# Patient Record
Sex: Male | Born: 1965 | Race: White | Hispanic: No | Marital: Single | Smoking: Never smoker
Health system: Southern US, Community
[De-identification: ages and names within clinical notes are randomized; demographics above are authoritative.]

## PROBLEM LIST (undated history)

## (undated) DIAGNOSIS — F419 Anxiety disorder, unspecified: Secondary | ICD-10-CM

## (undated) DIAGNOSIS — F1010 Alcohol abuse, uncomplicated: Secondary | ICD-10-CM

## (undated) HISTORY — PX: WISDOM TOOTH EXTRACTION: SHX21

## (undated) HISTORY — PX: LACERATION REPAIR: SHX5168

---

## 2011-05-25 ENCOUNTER — Emergency Department (HOSPITAL_COMMUNITY): Payer: Self-pay

## 2011-05-25 ENCOUNTER — Emergency Department (HOSPITAL_COMMUNITY)
Admission: EM | Admit: 2011-05-25 | Discharge: 2011-05-25 | Disposition: A | Discharge status: Home / Self Care | Payer: Self-pay | Attending: Emergency Medicine | Admitting: Emergency Medicine

## 2011-05-25 DIAGNOSIS — I10 Essential (primary) hypertension: Secondary | ICD-10-CM | POA: Insufficient documentation

## 2011-05-25 DIAGNOSIS — F411 Generalized anxiety disorder: Secondary | ICD-10-CM | POA: Insufficient documentation

## 2011-05-25 DIAGNOSIS — R42 Dizziness and giddiness: Secondary | ICD-10-CM | POA: Insufficient documentation

## 2011-05-25 DIAGNOSIS — R079 Chest pain, unspecified: Secondary | ICD-10-CM | POA: Insufficient documentation

## 2011-05-25 DIAGNOSIS — R5381 Other malaise: Secondary | ICD-10-CM | POA: Insufficient documentation

## 2011-05-25 DIAGNOSIS — R0602 Shortness of breath: Secondary | ICD-10-CM | POA: Insufficient documentation

## 2011-05-25 LAB — CBC
HCT: 42.5 % (ref 39.0–52.0)
Hemoglobin: 14.7 g/dL (ref 13.0–17.0)
MCH: 33.6 pg (ref 26.0–34.0)
MCV: 97 fL (ref 78.0–100.0)
Platelets: 74 10*3/uL — ABNORMAL LOW (ref 150–400)
RBC: 4.38 MIL/uL (ref 4.22–5.81)
WBC: 5.4 10*3/uL (ref 4.0–10.5)

## 2011-05-25 LAB — COMPREHENSIVE METABOLIC PANEL
ALT: 64 U/L — ABNORMAL HIGH (ref 0–53)
Alkaline Phosphatase: 91 U/L (ref 39–117)
BUN: 7 mg/dL (ref 6–23)
Chloride: 100 mEq/L (ref 96–112)
GFR calc Af Amer: >90 mL/min (ref >90)
Glucose, Bld: 112 mg/dL — ABNORMAL HIGH (ref 70–99)
Potassium: 4.3 mEq/L (ref 3.5–5.1)
Sodium: 140 mEq/L (ref 135–145)
Total Bilirubin: 0.7 mg/dL (ref 0.3–1.2)
Total Protein: 8.2 g/dL (ref 6.0–8.3)

## 2011-05-25 LAB — RAPID URINE DRUG SCREEN, HOSP PERFORMED
Amphetamines: NOT DETECTED
Tetrahydrocannabinol: NOT DETECTED

## 2011-05-25 LAB — DIFFERENTIAL
Eosinophils Absolute: 0 10*3/uL (ref 0.0–0.7)
Monocytes Absolute: 0.8 10*3/uL (ref 0.1–1.0)

## 2012-09-14 IMAGING — CR DG CHEST 2V
2 series · 2 of 2 positions shown · non-contrast
Comparison: None.

CLINICAL DATA: Chest pain and shortness of breath.

CHEST - 2 VIEW

[w chest pa]
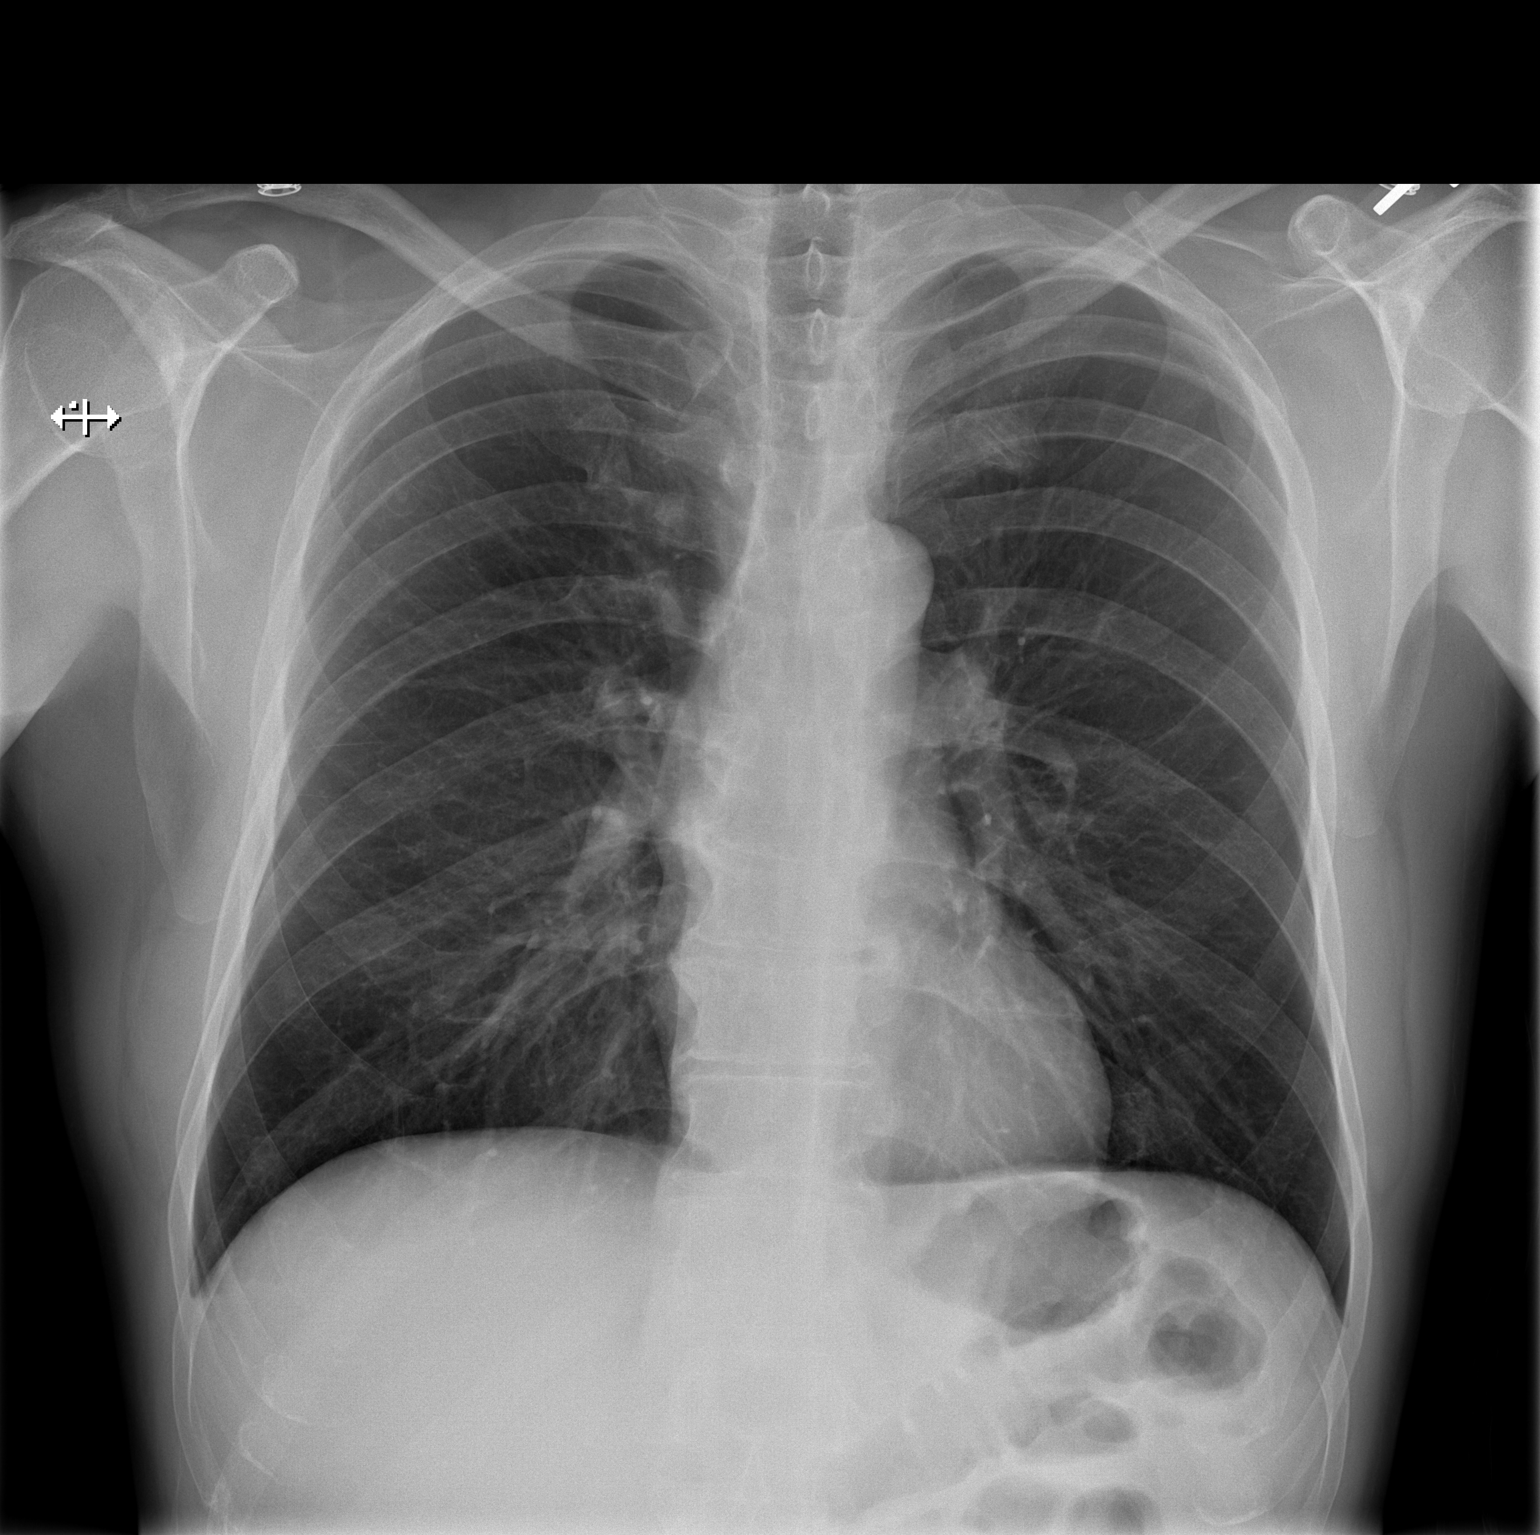

[w chest lat]
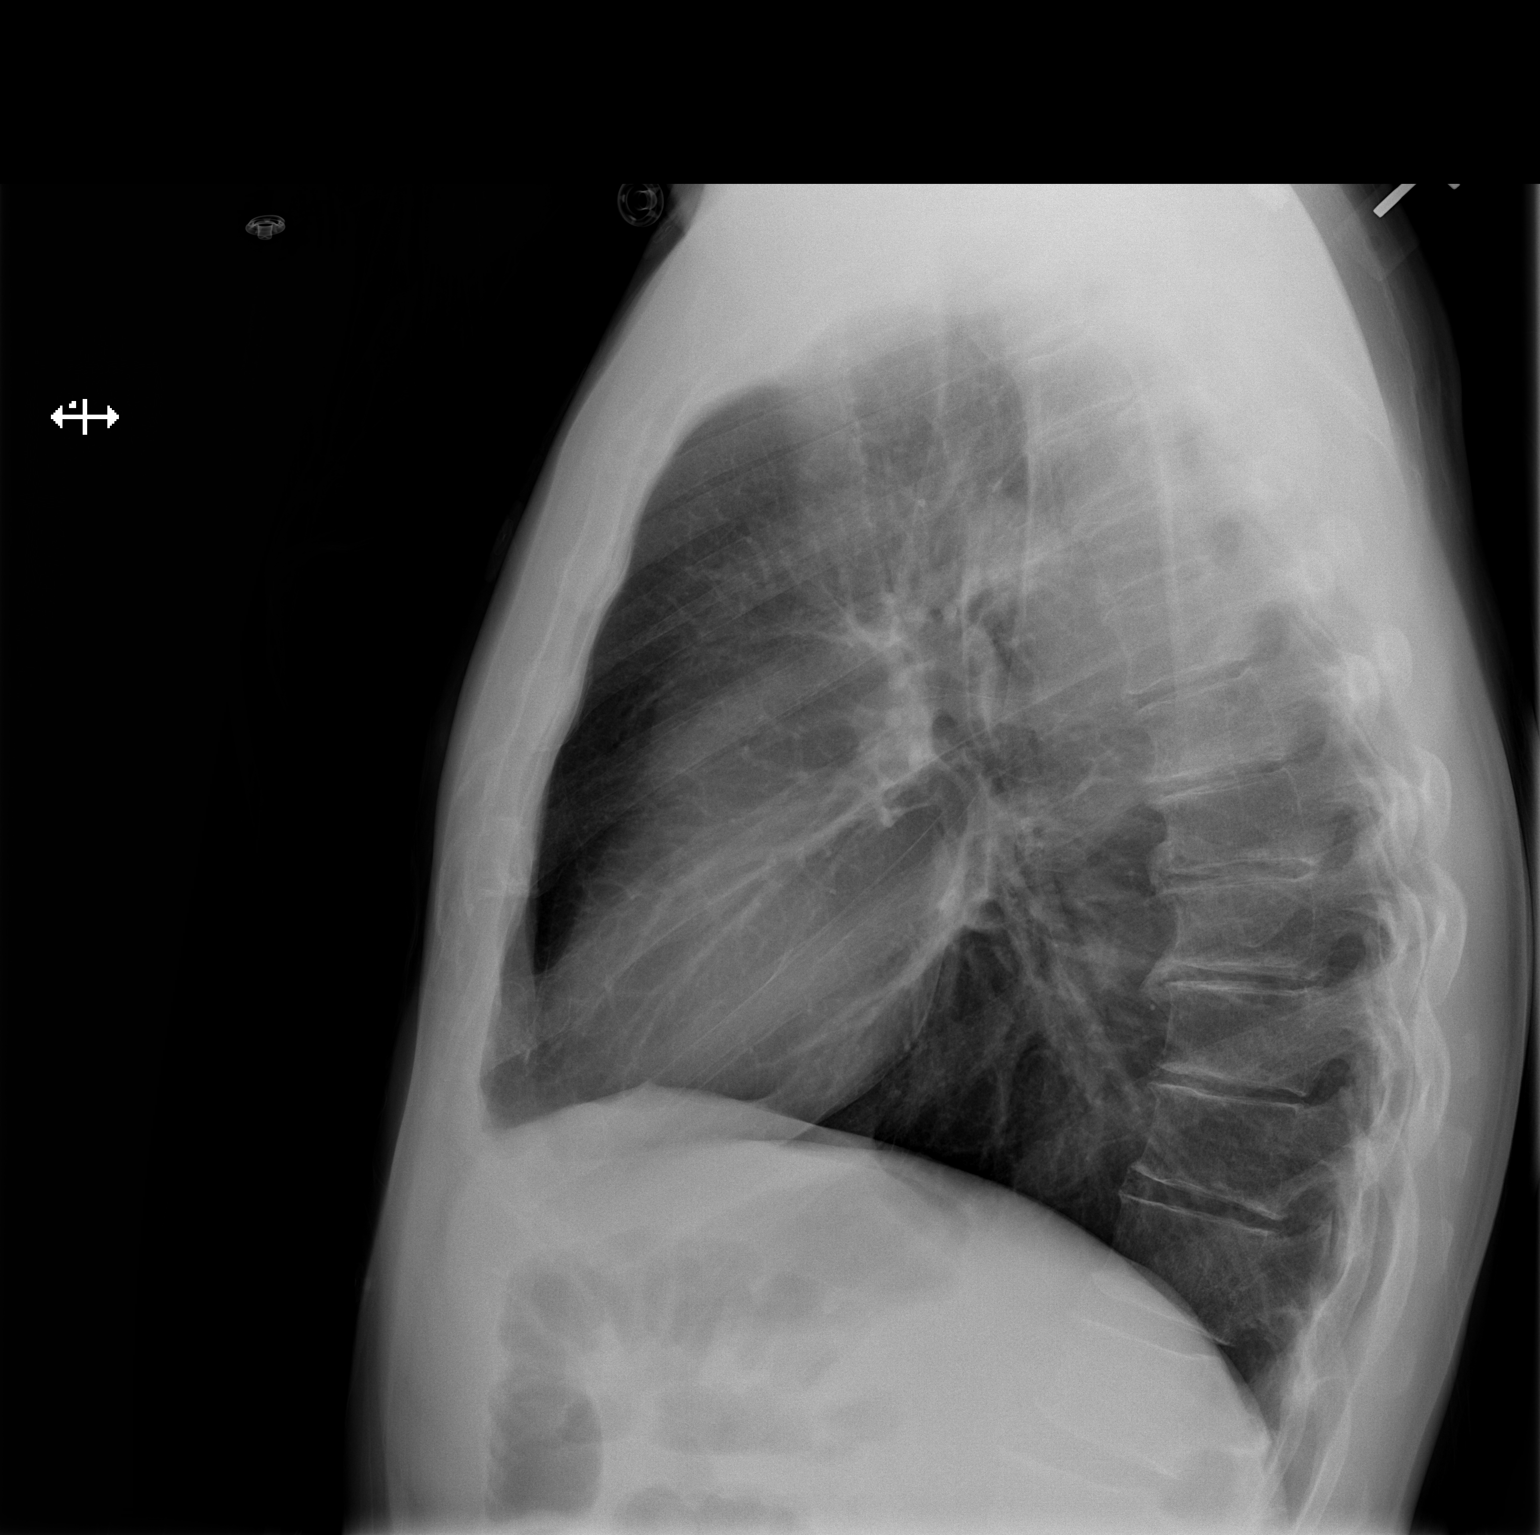

[2 of 2 positions shown; findings below may reference images not displayed]

FINDINGS: Lungs are clear.  Heart size is normal.  No pneumothorax
pleural effusion.
IMPRESSION: Negative chest.

## 2012-09-14 IMAGING — CT CT HEAD W/O CM
2 series · 16 of 30 positions shown, 20 images · non-contrast
Comparison: None.

CLINICAL DATA: Awoke with arms spinning and chest pains.

CT HEAD WITHOUT CONTRAST
TECHNIQUE: Contiguous axial images were obtained from the base of
the skull through the vertex without contrast.

[Series 2: head w/o · axial · non-contrast · 0.49mm/px · z∈[+111,+241]mm · 13 of 32 slices shown, 17 images]
[im 3/32  brain]
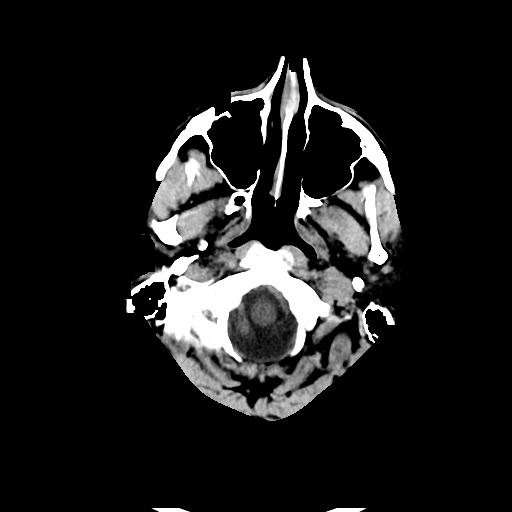
[im 3/32  bone]
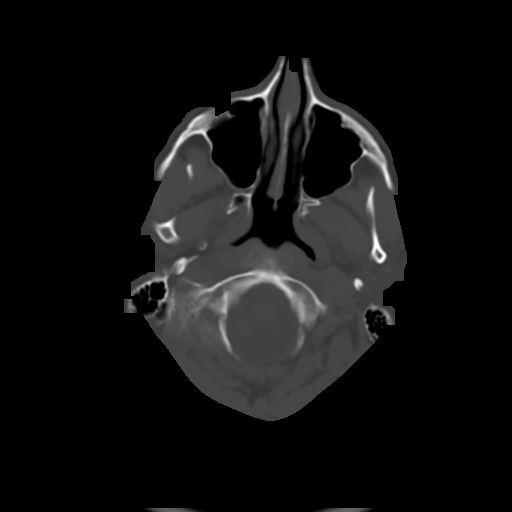
[im 5/32  brain]
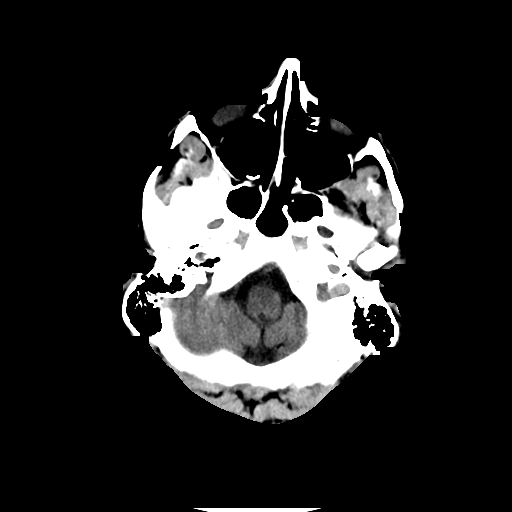
[im 7/32  brain]
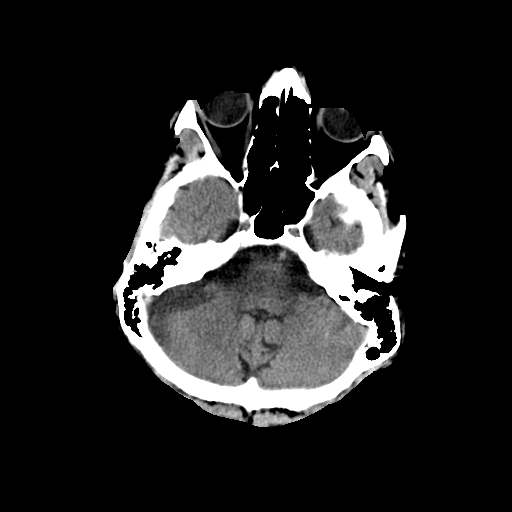
[im 9/32  brain]
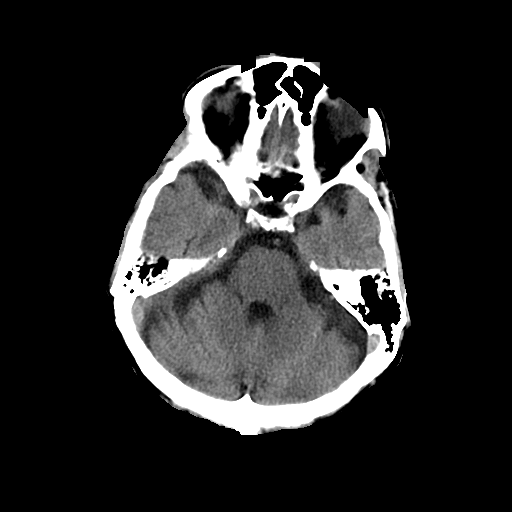
[im 12/32  brain]
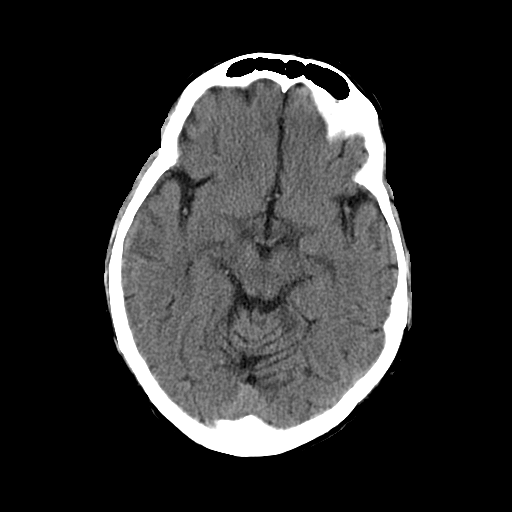
[im 12/32  bone]
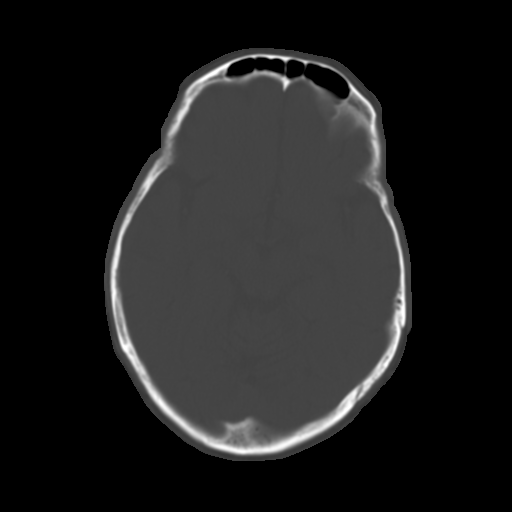
[im 14/32  brain]
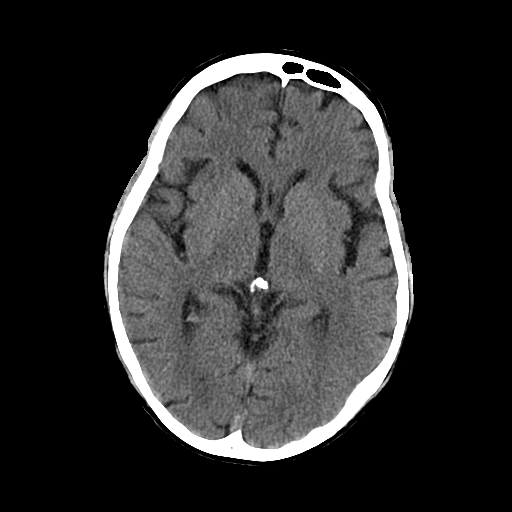
[im 16/32  brain]
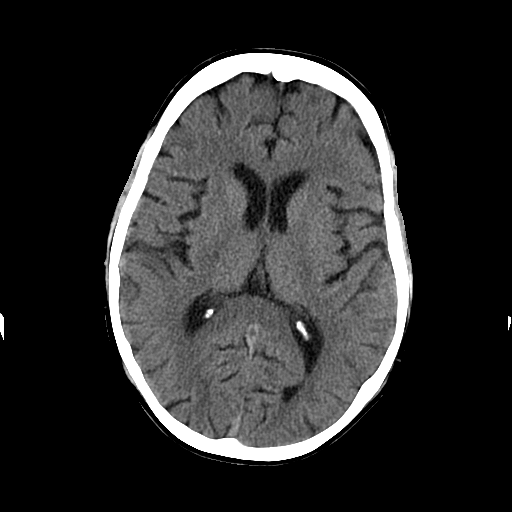
[im 18/32  brain]
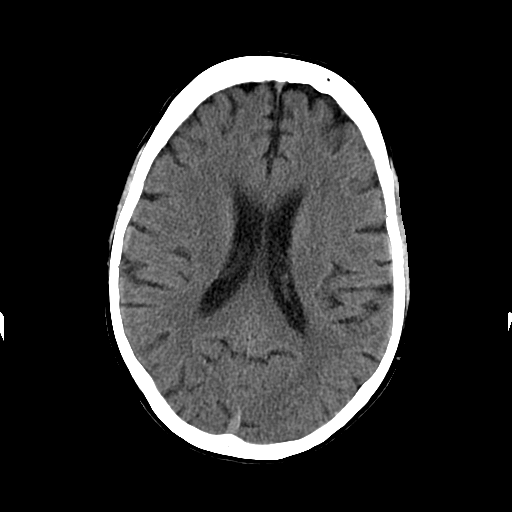
[im 20/32  brain]
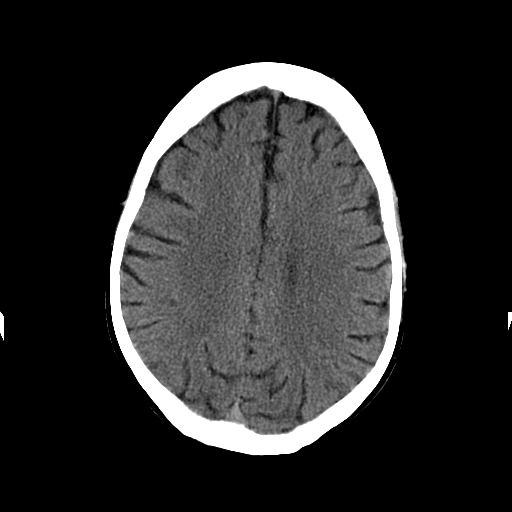
[im 20/32  bone]
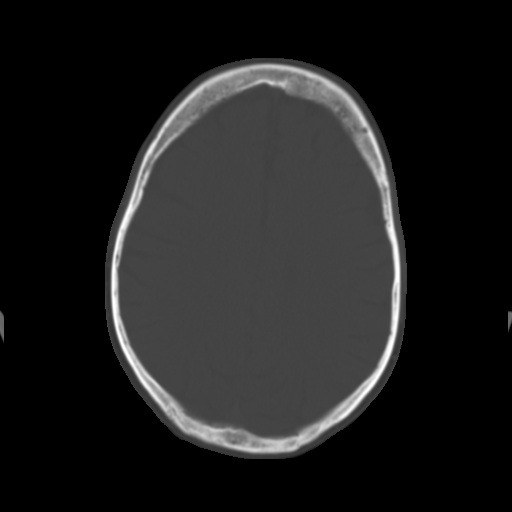
[im 23/32  brain]
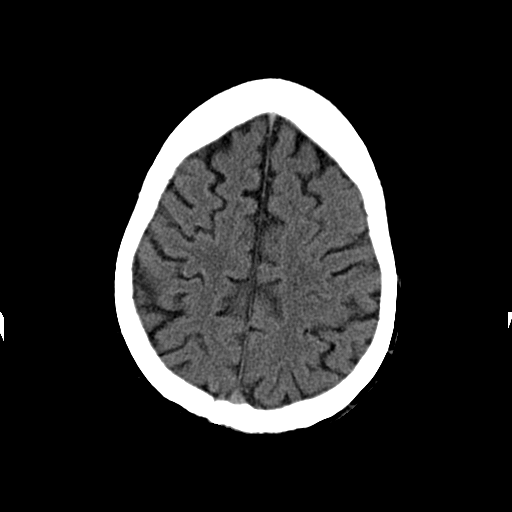
[im 25/32  brain]
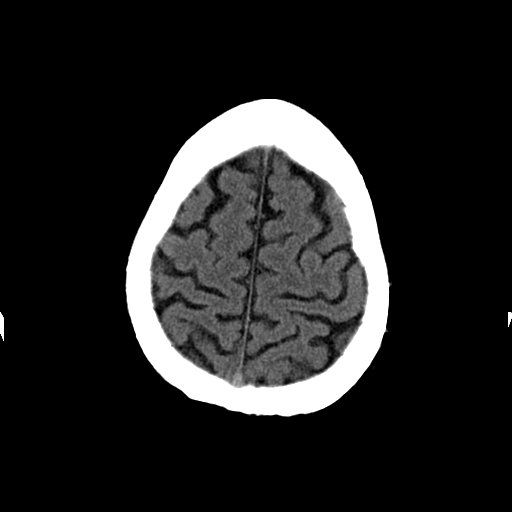
[im 27/32  brain]
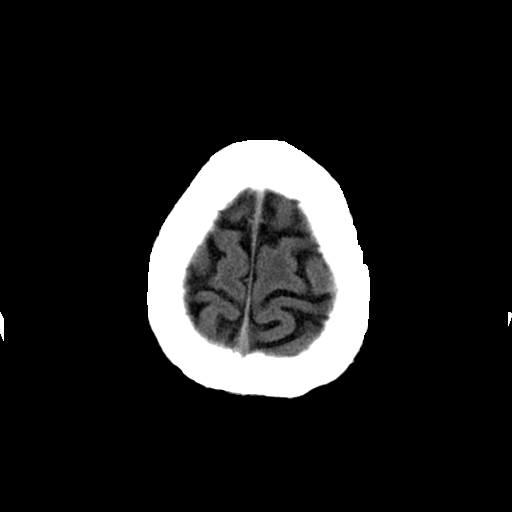
[im 29/32  brain]
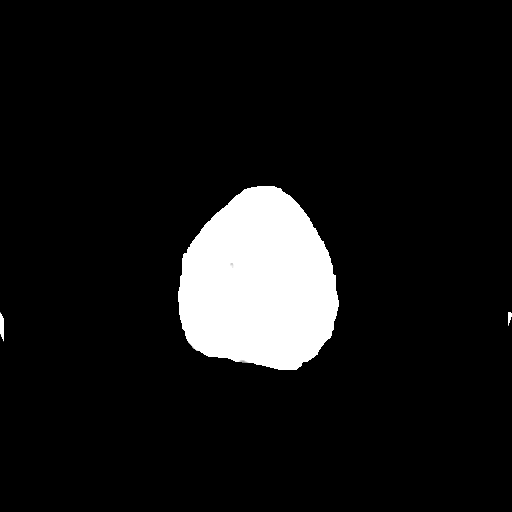
[im 29/32  bone]
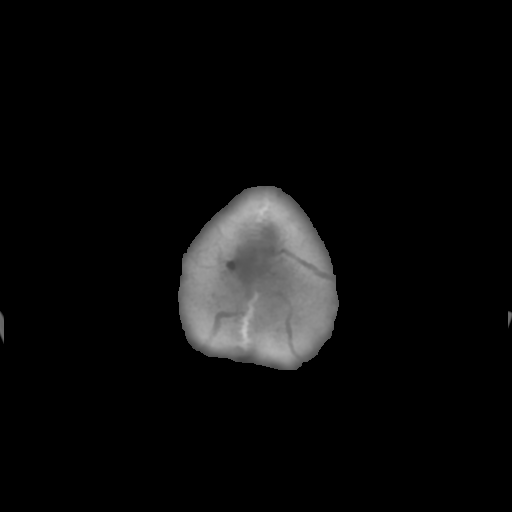

[Series 3: head w/o bone · axial · non-contrast · 0.49mm/px · z∈[+111,+156]mm · 3 of 32 slices shown]
[im 3/32  bone]
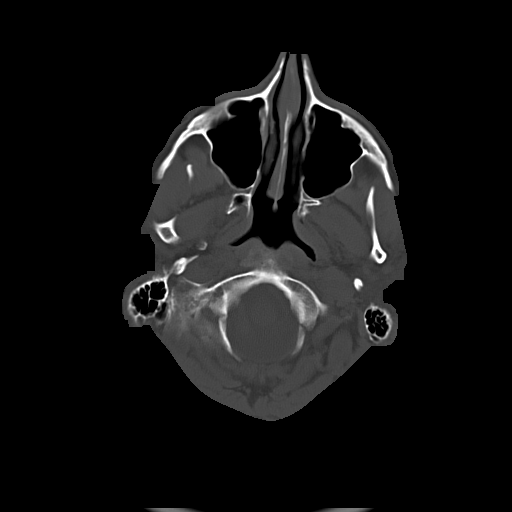
[im 7/32  bone]
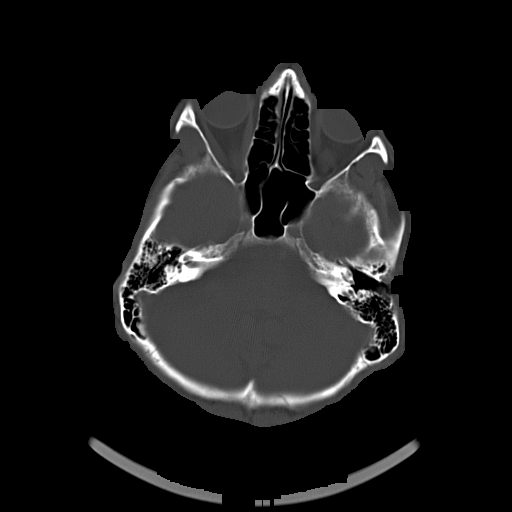
[im 12/32  bone]
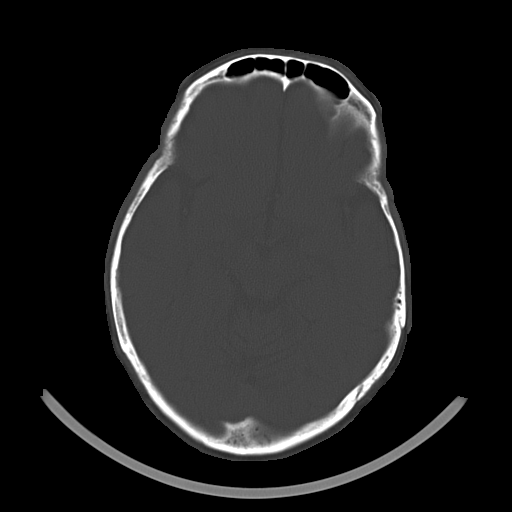

[16 of 30 positions shown; findings below may reference images not displayed]

FINDINGS: No intracranial hemorrhage.

No CT evidence of large acute infarct.  Small acute infarct cannot
be excluded by CT.

No intracranial mass lesion detected on this unenhanced exam.

Mild global atrophy.

The density of the basilar tip is equivalent to surrounding vessels
of similar size without definitive findings of thrombosis.

Sella may be partially empty.

Visualized sinuses and mastoid air cells are clear.  Orbital
structures appear to be intact.
IMPRESSION: No intracranial hemorrhage or CT evidence of large acute infarct.

Mild atrophy without hydrocephalus.

## 2012-10-17 ENCOUNTER — Encounter (HOSPITAL_COMMUNITY): Payer: Self-pay | Admitting: Emergency Medicine

## 2012-10-17 DIAGNOSIS — F1010 Alcohol abuse, uncomplicated: Secondary | ICD-10-CM | POA: Insufficient documentation

## 2012-10-17 DIAGNOSIS — I10 Essential (primary) hypertension: Secondary | ICD-10-CM | POA: Insufficient documentation

## 2012-10-17 LAB — CBC WITH DIFFERENTIAL/PLATELET
Eosinophils Absolute: 0 10*3/uL (ref 0.0–0.7)
Eosinophils Relative: 0 % (ref 0–5)
Hemoglobin: 15.1 g/dL (ref 13.0–17.0)
Lymphs Abs: 1.4 10*3/uL (ref 0.7–4.0)
MCH: 35 pg — ABNORMAL HIGH (ref 26.0–34.0)
MCHC: 35.4 g/dL (ref 30.0–36.0)
MCV: 98.8 fL (ref 78.0–100.0)
Monocytes Absolute: 1 10*3/uL (ref 0.1–1.0)
Monocytes Relative: 16 % — ABNORMAL HIGH (ref 3–12)
RBC: 4.32 MIL/uL (ref 4.22–5.81)

## 2012-10-17 LAB — COMPREHENSIVE METABOLIC PANEL
Alkaline Phosphatase: 61 U/L (ref 39–117)
BUN: 11 mg/dL (ref 6–23)
Calcium: 9.6 mg/dL (ref 8.4–10.5)
Creatinine, Ser: 0.76 mg/dL (ref 0.50–1.35)
GFR calc Af Amer: >90 mL/min (ref >90)
Glucose, Bld: 91 mg/dL (ref 70–99)
Total Protein: 8.4 g/dL — ABNORMAL HIGH (ref 6.0–8.3)

## 2012-10-17 LAB — RAPID URINE DRUG SCREEN, HOSP PERFORMED
Cocaine: NOT DETECTED
Opiates: NOT DETECTED

## 2012-10-17 LAB — ETHANOL: Alcohol, Ethyl (B): <11 mg/dL (ref 0–11)

## 2012-10-17 NOTE — ED Notes (Signed)
PT. REQUESTING DETOX FOR ALCOHOL ABUSE , LAST DRINK YESTERDAY , DENIES SUICIDAL IDEATION , CALM AND COOPERATIVE.

## 2012-10-18 ENCOUNTER — Emergency Department (HOSPITAL_COMMUNITY)
Admission: EM | Admit: 2012-10-18 | Discharge: 2012-10-21 | Disposition: A | Discharge status: Home / Self Care | Payer: Self-pay | Attending: Emergency Medicine | Admitting: Emergency Medicine

## 2012-10-18 DIAGNOSIS — F1010 Alcohol abuse, uncomplicated: Secondary | ICD-10-CM

## 2012-10-18 HISTORY — DX: Alcohol abuse, uncomplicated: F10.10

## 2012-10-18 MED ORDER — VITAMIN B-1 100 MG PO TABS
100.0000 mg | ORAL_TABLET | Freq: Every day | ORAL | Status: DC
  Administered 2012-10-18 – 2012-10-21 (×4): 100 mg via ORAL
  Filled 2012-10-18 (×4): qty 1

## 2012-10-18 MED ORDER — ONDANSETRON HCL 8 MG PO TABS: 4.0000 mg | ORAL_TABLET | Freq: Three times a day (TID) | ORAL | Status: DC | PRN

## 2012-10-18 MED ORDER — LORAZEPAM 2 MG/ML IJ SOLN
1.0000 mg | Freq: Once | INTRAMUSCULAR | Status: AC
  Administered 2012-10-18: 1 mg via INTRAVENOUS
  Filled 2012-10-18: qty 1

## 2012-10-18 MED ORDER — ZOLPIDEM TARTRATE 5 MG PO TABS: 5.0000 mg | ORAL_TABLET | Freq: Every evening | ORAL | Status: DC | PRN

## 2012-10-18 MED ORDER — ACETAMINOPHEN 325 MG PO TABS: 650.0000 mg | ORAL_TABLET | ORAL | Status: DC | PRN

## 2012-10-18 MED ORDER — LORAZEPAM 2 MG/ML IJ SOLN: 1.0000 mg | Freq: Four times a day (QID) | INTRAMUSCULAR | Status: AC | PRN

## 2012-10-18 MED ORDER — LORAZEPAM 1 MG PO TABS
1.0000 mg | ORAL_TABLET | Freq: Four times a day (QID) | ORAL | Status: AC | PRN
  Administered 2012-10-19 – 2012-10-20 (×5): 1 mg via ORAL
  Filled 2012-10-18 (×5): qty 1

## 2012-10-18 MED ORDER — LORAZEPAM 1 MG PO TABS
0.0000 mg | ORAL_TABLET | Freq: Four times a day (QID) | ORAL | Status: AC
  Administered 2012-10-18: 3 mg via ORAL
  Administered 2012-10-18: 2 mg via ORAL
  Administered 2012-10-18: 3 mg via ORAL
  Administered 2012-10-19 (×3): 1 mg via ORAL
  Filled 2012-10-18: qty 3
  Filled 2012-10-18: qty 1
  Filled 2012-10-18: qty 3
  Filled 2012-10-18: qty 1
  Filled 2012-10-18: qty 2
  Filled 2012-10-18 (×2): qty 1

## 2012-10-18 MED ORDER — LORAZEPAM 1 MG PO TABS
0.0000 mg | ORAL_TABLET | Freq: Two times a day (BID) | ORAL | Status: DC
  Administered 2012-10-19: 1 mg via ORAL

## 2012-10-18 MED ORDER — SODIUM CHLORIDE 0.9 % IV BOLUS (SEPSIS)
1000.0000 mL | Freq: Once | INTRAVENOUS | Status: AC
  Administered 2012-10-18: 1000 mL via INTRAVENOUS

## 2012-10-18 MED ORDER — ADULT MULTIVITAMIN W/MINERALS CH
1.0000 | ORAL_TABLET | Freq: Every day | ORAL | Status: DC
  Administered 2012-10-18 – 2012-10-21 (×4): 1 via ORAL
  Filled 2012-10-18 (×4): qty 1

## 2012-10-18 MED ORDER — THIAMINE HCL 100 MG/ML IJ SOLN: 100.0000 mg | Freq: Every day | INTRAMUSCULAR | Status: DC

## 2012-10-18 MED ORDER — ALUM & MAG HYDROXIDE-SIMETH 200-200-20 MG/5ML PO SUSP: 30.0000 mL | ORAL | Status: DC | PRN

## 2012-10-18 MED ORDER — FOLIC ACID 1 MG PO TABS
1.0000 mg | ORAL_TABLET | Freq: Every day | ORAL | Status: DC
  Administered 2012-10-18 – 2012-10-21 (×4): 1 mg via ORAL
  Filled 2012-10-18 (×4): qty 1

## 2012-10-18 NOTE — ED Provider Notes (Signed)
Medical screening examination/treatment/procedure(s) were performed by non-physician practitioner and as supervising physician I was immediately available for consultation/collaboration.  Olivia Mackie, MD 10/18/12 [phone]

## 2012-10-18 NOTE — ED Notes (Signed)
Pt. States "I'm feeling better". CIWA scale reflects improvement. Denies needs at this time. Pt. Cooperative and calm.

## 2012-10-18 NOTE — ED Notes (Signed)
Patient changed into paper scrubs.  Belonging inventoried and placed in locker.

## 2012-10-18 NOTE — ED Notes (Signed)
[facility] team member in room with pt

## 2012-10-18 NOTE — ED Provider Notes (Addendum)
Patient is here for alcohol detox. Last alcohol was on Wednesday. Patient was tremulous this morning and 30 minutes prior to my evaluating shin he received 3 mg of Ativan. When I saw and he states he was feeling much better only mild tremors in the hands. He is waiting for [facility] placement  8:14 AM CIWA score improved today and pt will be going to [place], MD 10/18/12 1610  Gwyneth Sprout, MD 10/19/12 [phone]

## 2012-10-18 NOTE — ED Provider Notes (Signed)
History     CSN: [id number]  Arrival date & time 10/17/12  2036   First MD Initiated Contact with Patient 10/18/12 0122      Chief Complaint  Patient presents with  . Alcohol Problem    (Consider location/radiation/quality/duration/timing/severity/associated sxs/prior treatment) HPI  47 year old male with history of alcohol abuse presents requesting for detox from alcohol. Patient reports history of addiction to alcohol, last drink was yesterday. States he is here because his whole body is shaking from withdrawal.  He admits to alcohol abuse, drinking 10-12 beers daily. He denies history of smoking, or recreational drug use. He denies SI/HI/hallucination. States he has good family support. He denies any other symptoms. He has not seek for help here in ER.   Past Medical History  Diagnosis Date  . Hypertension   . Alcohol abuse     History reviewed. No pertinent past surgical history.  No family history on file.  History  Substance Use Topics  . Smoking status: Never Smoker   . Smokeless tobacco: Not on file  . Alcohol Use: Yes      Review of Systems  Constitutional:       A complete 10 system review of systems was obtained and all systems are negative except as noted in the HPI and PMH.    Allergies  Review of patient's allergies indicates no known allergies.  Home Medications  No current outpatient prescriptions on file.  BP 141/104  Pulse 98  Temp(Src) 98.8 F (37.1 C) (Oral)  Resp 20  SpO2 98%  Physical Exam  Nursing note and vitals reviewed. Constitutional: He appears well-developed and well-nourished. No distress.  Awake, alert, nontoxic appearance  HENT:  Head: Atraumatic.  Eyes: Conjunctivae are normal. Right eye exhibits no discharge. Left eye exhibits no discharge.  Neck: Normal range of motion. Neck supple.  Cardiovascular: Normal rate and regular rhythm.   Pulmonary/Chest: Effort normal. No respiratory distress. He exhibits no tenderness.   Abdominal: Soft. There is no tenderness. There is no rebound.  Musculoskeletal: He exhibits no tenderness.  ROM appears intact, no obvious focal weakness  Neurological: He is alert. He has normal strength. He displays tremor. GCS eye subscore is 4. GCS verbal subscore is 5. GCS motor subscore is 6.  Skin: Skin is warm and dry. No rash noted.  Psychiatric: He has a normal mood and affect. He expresses no homicidal and no suicidal ideation.    ED Course  Procedures (including critical care time)  2:16 AM Pt request for alcohol detox.  Currently showing signs of alcohol withdrawal.  Psych Hold and CIWA protocol initiated.  Pt is medically cleared.  I have consulted with [facility] who agrees to continue management.    Labs Reviewed  CBC WITH DIFFERENTIAL - Abnormal; Notable for the following:    MCH 35.0 (*)    Monocytes Relative 16 (*)    Basophils Relative 2 (*)    All other components within normal limits  COMPREHENSIVE METABOLIC PANEL - Abnormal; Notable for the following:    Potassium 3.4 (*)    Chloride 90 (*)    Total Protein 8.4 (*)    AST 105 (*)    ALT 75 (*)    All other components within normal limits  ETHANOL  URINE RAPID DRUG SCREEN (HOSP PERFORMED)   No results found.   1. Alcohol abuse       MDM  BP 164/99  Pulse 73  Temp(Src) 99.4 F (37.4 C) (Oral)  Resp 22  Ht 5\' 10"  (1.778 m)  Wt 160 lb (72.576 kg)  BMI 22.96 kg/m2  SpO2 99%         Fayrene Helper, PA-C 10/18/12 [phone]

## 2012-10-18 NOTE — BH Assessment (Signed)
Assessment Note   Kenneth Good is an 47 y.o. male.  Patient came to [facility] seeking detox from ETOH.  He currently is drinking 10-12 beers daily for the last 6 years.  Patient reports that his last drink was around 02:00 on 03/20.  Patient reports that he was sober for about one year back around 2002.  Patient has not been in inpatient detox before.  His hands are shaking now and hre reports sweats, chills, tingling sensation in toes.  Patient was disoriented to date by two days.  Patient denies any SI, HI or A/V hallucinations.  Patient's material was sent to [facility] for review. Axis I: 303.90 ETOH dependence Axis II: Deferred Axis III:  Past Medical History  Diagnosis Date  . Hypertension   . Alcohol abuse    Axis IV: other psychosocial or environmental problems Axis V: 31-40 impairment in reality testing  Past Medical History:  Past Medical History  Diagnosis Date  . Hypertension   . Alcohol abuse     History reviewed. No pertinent past surgical history.  Family History: No family history on file.  Social History:  reports that he has never smoked. He does not have any smokeless tobacco history on file. He reports that  drinks alcohol. He reports that he does not use illicit drugs.  Additional Social History:  Alcohol / Drug Use Pain Medications: None reported Prescriptions: None reported Over the Counter: N/A History of alcohol / drug use?: Yes Longest period of sobriety (when/how long): Sober for a year in 2002. Negative Consequences of Use: Personal relationships Withdrawal Symptoms: Sweats;Tingling;Fever / Chills;Blackouts;Tremors;Cramps;Patient aware of relationship between substance abuse and physical/medical complications Substance #1 Name of Substance 1: Beer 1 - Age of First Use: 47 years of age 34 - Amount (size/oz): 10-12 cans daily 1 - Frequency: Daily use 1 - Duration: Last 6 years at that rate 1 - Last Use / Amount: Last [place] 02:00 on 03/20  CIWA: CIWA-Ar BP:  164/99 mmHg Pulse Rate: 73 Nausea and Vomiting: no nausea and no vomiting Tactile Disturbances: mild itching, pins and needles, burning or numbness Tremor: three Auditory Disturbances: very mild harshness or ability to frighten Paroxysmal Sweats: no sweat visible Visual Disturbances: very mild sensitivity Anxiety: mildly anxious Headache, Fullness in Head: mild Agitation: somewhat more than normal activity Orientation and Clouding of Sensorium: disoriented for data by no more than 2 calendar days CIWA-Ar Total: 13 COWS:    Allergies: No Known Allergies  Home Medications:  (Not in a hospital admission)  OB/GYN Status:  No LMP for male patient.  General Assessment Data Location of Assessment: [facility] ED Living Arrangements: Alone Can pt return to current living arrangement?: Yes Admission Status: Voluntary Is patient capable of signing voluntary admission?: Yes Transfer from: Acute Hospital Referral Source: Self/Family/Friend     Risk to self Suicidal Ideation: No Suicidal Intent: No Is patient at risk for suicide?: No Suicidal Plan?: No Access to Means: No What has been your use of drugs/alcohol within the last 12 months?: Daily use of ETOH Previous Attempts/Gestures: No How many times?: 0 Other Self Harm Risks: SA issues Triggers for Past Attempts: None known Intentional Self Injurious Behavior: None Family Suicide History: No Recent stressful life event(s): Other (Comment) (Realizing he must stop drinking) Persecutory voices/beliefs?: No Depression: No Depression Symptoms:  (Pt denies depressive symptoms.) Substance abuse history and/or treatment for substance abuse?: Yes Suicide prevention information given to non-admitted patients: Not applicable  Risk to Others Homicidal Ideation: No Thoughts of Harm  to Others: No Current Homicidal Intent: No Current Homicidal Plan: No Access to Homicidal Means: No Identified Victim: No one History of harm to others?:  No Assessment of Violence: None Noted Violent Behavior Description: Pt calm and cooperative Does patient have access to weapons?: No Criminal Charges Pending?: No Does patient have a court date: No  Psychosis Hallucinations: None noted Delusions: None noted  Mental Status Report Appear/Hygiene:  (Casual in blue scrubbs) Eye Contact: Good Motor Activity: Unsteady (Pt having tremors, reports being unsteady on feet) Speech: Logical/coherent Level of Consciousness: Quiet/awake Mood: Anxious Affect: Anxious Anxiety Level: Moderate Thought Processes: Coherent;Relevant Judgement: Impaired Orientation: Person;Place;Situation Obsessive Compulsive Thoughts/Behaviors: None  Cognitive Functioning Concentration: Normal Memory: Recent Impaired;Remote Intact IQ: Average Insight: Fair Impulse Control: Poor Appetite: Good Weight Loss: 0 Weight Gain: 0 Sleep: No Change Total Hours of Sleep: 8 Vegetative Symptoms: None  ADLScreening [facility] Services) Patient's cognitive ability adequate to safely complete daily activities?: Yes Patient able to express need for assistance with ADLs?: Yes Independently performs ADLs?: Yes (appropriate for developmental age)  Abuse/Neglect [facility]) Physical Abuse: Denies Verbal Abuse: Denies Sexual Abuse: Denies  Prior Inpatient Therapy Prior Inpatient Therapy: No Prior Therapy Dates: None Prior Therapy Facilty/Provider(s): N/A Reason for Treatment: N/a  Prior Outpatient Therapy Prior Outpatient Therapy: No Prior Therapy Dates: None Prior Therapy Facilty/Provider(s): None Reason for Treatment: N/A  ADL Screening (condition at time of admission) Patient's cognitive ability adequate to safely complete daily activities?: Yes Patient able to express need for assistance with ADLs?: Yes Independently performs ADLs?: Yes (appropriate for developmental age) Weakness of Legs: Both (Pt reports tingling/numbness in feet) Weakness of Arms/Hands:  None  Home Assistive Devices/Equipment Home Assistive Devices/Equipment: None    Abuse/Neglect Assessment (Assessment to be complete while patient is alone) Physical Abuse: Denies Verbal Abuse: Denies Sexual Abuse: Denies Exploitation of patient/patient's resources: Denies Self-Neglect: Denies     Merchant navy officer (For Healthcare) Advance Directive: Patient does not have advance directive;Patient would not like information    Additional Information 1:1 In Past 12 Months?: No CIRT Risk: No Elopement Risk: No Does patient have medical clearance?: Yes     Disposition:  Disposition Initial Assessment Completed for this Encounter: Yes Disposition of Patient: Inpatient treatment program;Referred to Type of inpatient treatment program: Adult Patient referred to: [facility]  On Site Evaluation by:   Reviewed with Physician:  Fayrene Helper, PA   Beatriz Stallion Ray 10/18/2012 4:48 AM

## 2012-10-18 NOTE — ED Notes (Signed)
Security paged to wand patient 

## 2012-10-19 MED ORDER — MECLIZINE HCL 25 MG PO TABS
25.0000 mg | ORAL_TABLET | Freq: Three times a day (TID) | ORAL | Status: DC | PRN
  Administered 2012-10-19 – 2012-10-21 (×5): 25 mg via ORAL
  Filled 2012-10-19 (×5): qty 1

## 2012-10-19 NOTE — BHH Counselor (Signed)
Pt is re-assessed today to determine his stability to participate in an etoh detox program. Pt has improved hbp and is still unstable with his gate. Pt confirms that "I've never had blood pressure medication" and "I'm real unsteady when I'm not drinking". Pt continues to have "shakes" and "unsteady". Pt would like to enter detox and said "that's too long, I have a job I gotta keep"; when told that once his vitals and gate stabilizes he could possibly go to [facility]. Pt continues to deni SI, HI, AVH or psychosis. Pt is more interested in a detox program that is less than 14 days. Pt reports that in 2002 he was clean for 1 year and said "I'ver been in treatment before, I stopped on my own". Writer called [facility] and was told by [phone], "We only had 1 discharge today, so we don't have anything; maybe tomorrow". Denice Bors, AADC 10/19/2012 2:40 PM
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
## 2012-10-20 NOTE — ED Provider Notes (Signed)
Filed Vitals:   10/20/12 1205  BP: 131/89  Pulse: 79  Temp: 98.4 F (36.9 C)  Resp: 14     Pt is comfortable, no longer in withdrawals, no evidence of DT's.  Pt likely can be discharged and follow up with intensive outpt therapy that [facility] will facilitate.  No SI, HI.  Gavin Pound. Marcos Ruelas, MD 10/20/12 [phone]

## 2012-10-20 NOTE — BHH Counselor (Signed)
Pt will be provided OP treatment information upon discharge on the morning of 10/21/12. Denice Bors, AADC 10/20/2012 6:25 PM

## 2012-10-21 MED ORDER — MECLIZINE HCL 25 MG PO TABS: 25.0000 mg | ORAL_TABLET | Freq: Three times a day (TID) | ORAL | Status: DC

## 2012-10-21 NOTE — ED Notes (Signed)
Patient is resting comfortably. 

## 2013-02-01 ENCOUNTER — Emergency Department (HOSPITAL_COMMUNITY)
Admission: EM | Admit: 2013-02-01 | Discharge: 2013-02-02 | Disposition: A | Discharge status: Home / Self Care | Payer: Self-pay | Attending: Emergency Medicine | Admitting: Emergency Medicine

## 2013-02-01 DIAGNOSIS — S61219A Laceration without foreign body of unspecified finger without damage to nail, initial encounter: Secondary | ICD-10-CM

## 2013-02-01 DIAGNOSIS — Y929 Unspecified place or not applicable: Secondary | ICD-10-CM | POA: Insufficient documentation

## 2013-02-01 DIAGNOSIS — W260XXA Contact with knife, initial encounter: Secondary | ICD-10-CM | POA: Insufficient documentation

## 2013-02-01 DIAGNOSIS — S61209A Unspecified open wound of unspecified finger without damage to nail, initial encounter: Secondary | ICD-10-CM | POA: Insufficient documentation

## 2013-02-01 DIAGNOSIS — Z79899 Other long term (current) drug therapy: Secondary | ICD-10-CM | POA: Insufficient documentation

## 2013-02-01 DIAGNOSIS — Y9389 Activity, other specified: Secondary | ICD-10-CM | POA: Insufficient documentation

## 2013-02-01 DIAGNOSIS — I10 Essential (primary) hypertension: Secondary | ICD-10-CM | POA: Insufficient documentation

## 2013-02-01 NOTE — ED Notes (Signed)
Pt states he was shucking oysters and cut his L thumb. Pt also has small lac on 4th finger on L hand. Bleeding controlled with pressure dressing. Pt ambulatory to exam room with steady gait. Pt states his wife drove him here. Pt reports he is up to date on tetanus status.

## 2013-02-01 NOTE — ED Provider Notes (Signed)
History    This chart was scribed for non-physician practitioner Marlon Pel PA-C working with Brantley Fling Ashley Jacobs, ED scribe. This patient was seen in room WTR6/WTR6 and the patient's care was started at 11:23 pm. CSN: [id number] Arrival date & time 02/01/13  2310    Chief Complaint  Patient presents with  . Extremity Laceration    The history is provided by the patient and medical records. No language interpreter was used.    HPI Comments: Kenneth Good is a 47 y.o. male who presents to the Emergency Department complaining of laceration to his L thumb and fourth finger after cutting the hand with a knife while shucking oysters 30 minutes PTA. Pt reports having moderate constant pain at the onset of the accident.  Pt's tetanus is UTD. Pt denies taking any medication PTA.   Past Medical History  Diagnosis Date  . Hypertension   . Alcohol abuse    No past surgical history on file. No family history on file. History  Substance Use Topics  . Smoking status: Never Smoker   . Smokeless tobacco: Not on file  . Alcohol Use: Yes    Review of Systems  Constitutional: Negative for fever, diaphoresis, appetite change, fatigue and unexpected weight change.  HENT: Negative for mouth sores and neck stiffness.   Eyes: Negative for visual disturbance.  Respiratory: Negative for cough, chest tightness, shortness of breath and wheezing.   Cardiovascular: Negative for chest pain.  Gastrointestinal: Negative for nausea, vomiting, abdominal pain, diarrhea and constipation.  Endocrine: Negative for polydipsia, polyphagia and polyuria.  Genitourinary: Negative for dysuria, urgency, frequency and hematuria.  Musculoskeletal: Negative for back pain.  Skin: Negative for rash. Wound: laceration to left thumb  4th finger.  Allergic/Immunologic: Negative for immunocompromised state.  Neurological: Negative for syncope, light-headedness and headaches.  Hematological: Does not  bruise/bleed easily.  Psychiatric/Behavioral: Negative for sleep disturbance. The patient is not nervous/anxious.     Allergies  Review of patient's allergies indicates no known allergies.  Home Medications   Current Outpatient Rx  Name  Route  Sig  Dispense  Refill  . meclizine (ANTIVERT) 25 MG tablet   Oral   Take 1 tablet (25 mg total) by mouth 3 (three) times daily.   21 tablet   0    BP 130/85  Pulse 78  Temp(Src) 98.1 F (36.7 C) (Oral)  Resp 16  SpO2 99% Physical Exam  Nursing note and vitals reviewed. Constitutional: He is oriented to person, place, and time. He appears well-developed and well-nourished. No distress.  HENT:  Head: Normocephalic and atraumatic.  Eyes: Conjunctivae are normal. No scleral icterus.  Neck: Normal range of motion.  Cardiovascular: Normal rate, regular rhythm and intact distal pulses.   Pulmonary/Chest: Effort normal and breath sounds normal. No respiratory distress.  Musculoskeletal: Normal range of motion. He exhibits no edema.       Left hand: He exhibits tenderness and laceration. He exhibits normal range of motion, no bony tenderness, normal two-point discrimination, normal capillary refill, no deformity and no swelling. Normal strength noted.       Hands: Neurological: He is alert and oriented to person, place, and time.  Skin: Skin is warm and dry. He is not diaphoretic.    ED Course  Procedures (including critical care time) DIAGNOSTIC STUDIES: Oxygen Saturation is 99% on room air, normal by my interpretation.    COORDINATION OF CARE: 11:25 pm. Discussed course of care with pt which includes sutures . Pt  understands and agrees.  LACERATION REPAIR PROCEDURE NOTE The patient's identification was confirmed and consent was obtained. This procedure was performed by Marlon Pel PA-C at 11:33 PM. Site: left thumb Sterile procedures observed Anesthetic used (type and amt): 2% lidocaine w/o epi, 2 cc suture type/size:4-0  ethilon Length 2 cm # of Sutures: 6 Technique: uninterrupted Antibx ointment applied Tetanus UTD  Site anesthetized, irrigated with NS, explored without evidence of foreign body, wound well approximated, site covered with dry, sterile dressing.  Patient tolerated procedure well without complications. Instructions for care discussed verbally and patient provided with additional written instructions for homecare and f/u.  LACERATION REPAIR PROCEDURE NOTE The patient's identification was confirmed and consent was obtained. This procedure was performed by Marlon Pel PA-C, at 11:33 PM. Site: fourth finger of the left hand Sterile procedures observed Anesthetic used (type and amt):2% lidocaine w/o epi, 1 cc Suture type/size:4-0 ethilon Length:1 cm # of Sutures: 2 Technique: uninterrupted Antibx ointment applied Tetanus UTD Site anesthetized, irrigated with NS, explored without evidence of foreign body, wound well approximated, site covered with dry, sterile dressing.  Patient tolerated procedure well without complications. Instructions for care discussed verbally and patient provided with additional written instructions for homecare and f/u.   Labs Reviewed - No data to display No results found. 1. Laceration of finger of left hand, initial encounter     MDM  Sutures to be removed in 7-10 days. Patient wound thoroughly cleaned and irrigated.  Tetanus up to date.  47 y.o.Kenneth Good's evaluation in the Emergency Department is complete. It has been determined that no acute conditions requiring further emergency intervention are present at this time. The patient/guardian have been advised of the diagnosis and plan. We have discussed signs and symptoms that warrant return to the ED, such as changes or worsening in symptoms.  Vital signs are stable at discharge. Filed Vitals:   02/01/13 2316  BP: 130/85  Pulse: 78  Temp: 98.1 F (36.7 C)  Resp: 16    Patient/guardian  has voiced understanding and agreed to follow-up with the PCP or specialist.   I personally performed the services described in this documentation, which was scribed in my presence. The recorded information has been reviewed and is accurate.   Dorthula Matas, PA-C 02/02/13 0015

## 2013-02-02 NOTE — ED Provider Notes (Signed)
Medical screening examination/treatment/procedure(s) were performed by non-physician practitioner and as supervising physician I was immediately available for consultation/collaboration.    Gilda Crease, MD 02/02/13 2259

## 2013-02-03 ENCOUNTER — Encounter (HOSPITAL_COMMUNITY): Payer: Self-pay | Admitting: Emergency Medicine

## 2013-02-03 ENCOUNTER — Emergency Department (HOSPITAL_COMMUNITY): Payer: Self-pay

## 2013-02-03 ENCOUNTER — Emergency Department (HOSPITAL_COMMUNITY)
Admission: EM | Admit: 2013-02-03 | Discharge: 2013-02-03 | Disposition: A | Discharge status: Home / Self Care | Payer: Self-pay | Attending: Emergency Medicine | Admitting: Emergency Medicine

## 2013-02-03 DIAGNOSIS — S02609A Fracture of mandible, unspecified, initial encounter for closed fracture: Secondary | ICD-10-CM | POA: Insufficient documentation

## 2013-02-03 DIAGNOSIS — S0081XA Abrasion of other part of head, initial encounter: Secondary | ICD-10-CM

## 2013-02-03 DIAGNOSIS — F1010 Alcohol abuse, uncomplicated: Secondary | ICD-10-CM | POA: Insufficient documentation

## 2013-02-03 DIAGNOSIS — Y939 Activity, unspecified: Secondary | ICD-10-CM | POA: Insufficient documentation

## 2013-02-03 DIAGNOSIS — W1809XA Striking against other object with subsequent fall, initial encounter: Secondary | ICD-10-CM | POA: Insufficient documentation

## 2013-02-03 DIAGNOSIS — Z4800 Encounter for change or removal of nonsurgical wound dressing: Secondary | ICD-10-CM | POA: Insufficient documentation

## 2013-02-03 DIAGNOSIS — S025XXA Fracture of tooth (traumatic), initial encounter for closed fracture: Secondary | ICD-10-CM | POA: Insufficient documentation

## 2013-02-03 DIAGNOSIS — IMO0002 Reserved for concepts with insufficient information to code with codable children: Secondary | ICD-10-CM | POA: Insufficient documentation

## 2013-02-03 DIAGNOSIS — Z23 Encounter for immunization: Secondary | ICD-10-CM | POA: Insufficient documentation

## 2013-02-03 DIAGNOSIS — Y929 Unspecified place or not applicable: Secondary | ICD-10-CM | POA: Insufficient documentation

## 2013-02-03 DIAGNOSIS — I10 Essential (primary) hypertension: Secondary | ICD-10-CM | POA: Insufficient documentation

## 2013-02-03 LAB — CBC
Hemoglobin: 15 g/dL (ref 13.0–17.0)
MCH: 31.8 pg (ref 26.0–34.0)
MCHC: 34.1 g/dL (ref 30.0–36.0)
MCV: 93.4 fL (ref 78.0–100.0)
Platelets: 199 10*3/uL (ref 150–400)
RBC: 4.71 MIL/uL (ref 4.22–5.81)

## 2013-02-03 LAB — BASIC METABOLIC PANEL
CO2: 22 mEq/L (ref 19–32)
Calcium: 9.6 mg/dL (ref 8.4–10.5)
Creatinine, Ser: 0.72 mg/dL (ref 0.50–1.35)
GFR calc non Af Amer: >90 mL/min (ref >90)
Glucose, Bld: 78 mg/dL (ref 70–99)

## 2013-02-03 MED ORDER — HYDROCODONE-ACETAMINOPHEN 7.5-325 MG/15ML PO SOLN: 15.0000 mL | Freq: Four times a day (QID) | ORAL | Status: DC | PRN

## 2013-02-03 MED ORDER — KETOROLAC TROMETHAMINE 30 MG/ML IJ SOLN: 30.0000 mg | Freq: Once | INTRAMUSCULAR | Status: DC

## 2013-02-03 MED ORDER — HYDROMORPHONE HCL PF 1 MG/ML IJ SOLN
1.0000 mg | Freq: Once | INTRAMUSCULAR | Status: AC
  Administered 2013-02-03: 1 mg via INTRAVENOUS
  Filled 2013-02-03: qty 1

## 2013-02-03 MED ORDER — HYDROMORPHONE HCL PF 1 MG/ML IJ SOLN
1.0000 mg | Freq: Once | INTRAMUSCULAR | Status: AC
  Administered 2013-02-03: 1 mg via INTRAMUSCULAR
  Filled 2013-02-03: qty 1

## 2013-02-03 MED ORDER — MORPHINE SULFATE 2 MG/ML IJ SOLN: 2.0000 mg | Freq: Once | INTRAMUSCULAR | Status: DC

## 2013-02-03 MED ORDER — TETANUS-DIPHTH-ACELL PERTUSSIS 5-2.5-18.5 LF-MCG/0.5 IM SUSP
0.5000 mL | Freq: Once | INTRAMUSCULAR | Status: AC
  Administered 2013-02-03: 0.5 mL via INTRAMUSCULAR
  Filled 2013-02-03: qty 0.5

## 2013-02-03 NOTE — Progress Notes (Signed)
[facility] has seen patient and provided him with a list of primary care resources. °

## 2013-02-03 NOTE — ED Notes (Signed)
Pt sts he drinks a few beers each day and is beginning to have tremors, md notified

## 2013-02-03 NOTE — ED Notes (Signed)
Pt sts he broke a tooth when he fell

## 2013-02-03 NOTE — ED Notes (Addendum)
Pt tripped and fell onto concrete 4 hrs ago. L/side of face struck step. Multiple facial abrasions noted on both lips with open wound on tip of chin. Bleeding controlled. Pt stated that his face is swollen and his jaw feels out of alignment. "feels a broken tooth on back lower jaw". Denies LOC  Pt has 11 sutures in l/thumb due to laceration 2 days ago LAST TETANUS OVER 10 YEARS

## 2013-02-03 NOTE — ED Notes (Signed)
Awaiting surgery consult.

## 2013-02-03 NOTE — ED Provider Notes (Signed)
History    CSN: [id number] Arrival date & time 02/03/13  1218  First MD Initiated Contact with Patient 02/03/13 1306     Chief Complaint  Patient presents with  . Facial Swelling  . Dental Injury  . Wound Check    7/5 l/thumb suture check  . Fall  . Head Injury    fell onto cement step 4 hrs ago. Denies LOC    HPI Pt reports jaw pain and lacerations of his lip and his chin that occurred this morning after falling over his cat. Mild to moderate pain. + trismus and malocclusioin. No LOC. No HA. No anticoagulant use. No other complaints. Tetanus updated 10 years ago. Pain is moderate in severity. Pain of left mandible pain  Past Medical History  Diagnosis Date  . Hypertension   . Alcohol abuse    History reviewed. No pertinent past surgical history. History reviewed. No pertinent family history. History  Substance Use Topics  . Smoking status: Never Smoker   . Smokeless tobacco: Not on file  . Alcohol Use: Yes    Review of Systems  All other systems reviewed and are negative.    Allergies  Review of patient's allergies indicates no known allergies.  Home Medications  No current outpatient prescriptions on file. BP 135/85  Pulse 94  Temp(Src) 98.4 F (36.9 C) (Oral)  Resp 18  Wt 165 lb (74.844 kg)  BMI 23.68 kg/m2  SpO2 100%  Physical Exam  Nursing note and vitals reviewed. Constitutional: He is oriented to person, place, and time. He appears well-developed and well-nourished.  HENT:  Head: Normocephalic and atraumatic.  Mild trismus and malocclusion.  Facial abrasions of both his upper lip and his chin without laceration.  Dentition without significant abnormality  Eyes: EOM are normal.  Neck: Normal range of motion.  Cardiovascular: Normal rate, regular rhythm, normal heart sounds and intact distal pulses.   Pulmonary/Chest: Effort normal and breath sounds normal. No respiratory distress.  Abdominal: Soft. He exhibits no distension. There is no  tenderness.  Genitourinary: Rectum normal.  Musculoskeletal: Normal range of motion.  Neurological: He is alert and oriented to person, place, and time.  Skin: Skin is warm and dry.  Psychiatric: He has a normal mood and affect. Judgment normal.    ED Course  Procedures (including critical care time) Labs Reviewed  CBC  BASIC METABOLIC PANEL   Ct Maxillofacial Wo Cm  02/03/2013   *RADIOLOGY REPORT*  Clinical Data: Trip and fall.  Left facial injury.  Facial abrasions and bleeding.  CT MAXILLOFACIAL WITHOUT CONTRAST  Technique:  Multidetector CT imaging of the maxillofacial structures was performed. Multiplanar CT image reconstructions were also generated.  Comparison: 05/25/2011  Findings: Bilateral mildly comminuted mandibular condyle fractures extend into the condylar articular surfaces.  There is also a nondisplaced fracture through the left mandibular angle, extending through a periapical lucency of tooth #18.  There is also a large cavity in tooth #18.  Zygomatic arches and pterygoid plates intact.  No additional facial fracture observed.  Orbits unremarkable.  IMPRESSION:  1.  Three mandibular fractures are present.  There are bilateral mildly comminuted fractures of the mandibular condyles extending to the articular surfaces, and also a nondisplaced fracture of the left mandibular angle extending through a periapical lucency of decayed tooth #18.   Original Report Authenticated By: Gaylyn Rong, M.D.   I personally reviewed the imaging tests through PACS system I reviewed available ER/hospitalization records through the EMR   1. Mandible  fracture, closed, initial encounter   2. Facial abrasion, initial encounter     MDM  2:59 PM Spoke with Dr Jearld Fenton of ENT personally reviewed the CT scan.  He will see the patient in the office tomorrow the next day.  He requests soft diet and pain medication.  The patient's areas of his face her facial abrasions and there is no indication for  repair.  Tetanus updated  Lyanne Co, MD 02/03/13 1622

## 2013-02-04 ENCOUNTER — Encounter (HOSPITAL_COMMUNITY): Payer: Self-pay | Admitting: Pharmacy Technician

## 2013-02-05 ENCOUNTER — Encounter (HOSPITAL_COMMUNITY): Payer: Self-pay | Admitting: Certified Registered"

## 2013-02-05 ENCOUNTER — Other Ambulatory Visit: Payer: Self-pay | Admitting: Otolaryngology

## 2013-02-05 NOTE — Preoperative (Signed)
Beta Blockers   Reason not to administer Beta Blockers:Not Applicable 

## 2013-02-06 ENCOUNTER — Ambulatory Visit (HOSPITAL_COMMUNITY)
Admission: RE | Admit: 2013-02-06 | Discharge: 2013-02-06 | Disposition: A | Discharge status: Home / Self Care | Payer: MEDICAID | Source: Ambulatory Visit | Attending: Otolaryngology | Admitting: Otolaryngology

## 2013-02-06 ENCOUNTER — Encounter (HOSPITAL_COMMUNITY): Payer: Self-pay | Admitting: Certified Registered"

## 2013-02-06 ENCOUNTER — Encounter (HOSPITAL_COMMUNITY)
Admission: RE | Disposition: A | Discharge status: Home / Self Care | Payer: Self-pay | Source: Ambulatory Visit | Attending: Otolaryngology

## 2013-02-06 ENCOUNTER — Encounter (HOSPITAL_COMMUNITY): Payer: Self-pay | Admitting: Anesthesiology

## 2013-02-06 ENCOUNTER — Ambulatory Visit (HOSPITAL_COMMUNITY): Payer: Self-pay | Admitting: Certified Registered"

## 2013-02-06 DIAGNOSIS — F1010 Alcohol abuse, uncomplicated: Secondary | ICD-10-CM | POA: Insufficient documentation

## 2013-02-06 DIAGNOSIS — X58XXXA Exposure to other specified factors, initial encounter: Secondary | ICD-10-CM | POA: Insufficient documentation

## 2013-02-06 DIAGNOSIS — S02640A Fracture of ramus of mandible, unspecified side, initial encounter for closed fracture: Secondary | ICD-10-CM | POA: Insufficient documentation

## 2013-02-06 DIAGNOSIS — I10 Essential (primary) hypertension: Secondary | ICD-10-CM | POA: Insufficient documentation

## 2013-02-06 DIAGNOSIS — S02610A Fracture of condylar process of mandible, unspecified side, initial encounter for closed fracture: Secondary | ICD-10-CM | POA: Insufficient documentation

## 2013-02-06 DIAGNOSIS — S02609A Fracture of mandible, unspecified, initial encounter for closed fracture: Secondary | ICD-10-CM

## 2013-02-06 HISTORY — PX: ORIF MANDIBULAR FRACTURE: SHX2127

## 2013-02-06 HISTORY — PX: CLOSED REDUCTION MANDIBLE: SHX5307

## 2013-02-06 LAB — CBC
HCT: 42.7 % (ref 39.0–52.0)
Hemoglobin: 14.2 g/dL (ref 13.0–17.0)
MCV: 96.2 fL (ref 78.0–100.0)
Platelets: 146 10*3/uL — ABNORMAL LOW (ref 150–400)
RBC: 4.44 MIL/uL (ref 4.22–5.81)
WBC: 4.5 10*3/uL (ref 4.0–10.5)

## 2013-02-06 SURGERY — CLOSED REDUCTION, MANDIBLE
Anesthesia: General | Site: Mouth | Wound class: Clean Contaminated

## 2013-02-06 MED ORDER — CLINDAMYCIN PHOSPHATE 600 MG/50ML IV SOLN
INTRAVENOUS | Status: AC
  Administered 2013-02-06: 600 mg via INTRAVENOUS
  Filled 2013-02-06: qty 50

## 2013-02-06 MED ORDER — LIDOCAINE-EPINEPHRINE 1 %-1:100000 IJ SOLN
INTRAMUSCULAR | Status: DC | PRN
  Administered 2013-02-06: 1 mL

## 2013-02-06 MED ORDER — CLINDAMYCIN HCL 300 MG PO CAPS: 300.0000 mg | ORAL_CAPSULE | Freq: Four times a day (QID) | ORAL | Status: DC

## 2013-02-06 MED ORDER — OXYMETAZOLINE HCL 0.05 % NA SOLN
NASAL | Status: AC
  Filled 2013-02-06: qty 15

## 2013-02-06 MED ORDER — DEXAMETHASONE SODIUM PHOSPHATE 4 MG/ML IJ SOLN
INTRAMUSCULAR | Status: DC | PRN
  Administered 2013-02-06: 8 mg via INTRAVENOUS

## 2013-02-06 MED ORDER — GLYCOPYRROLATE 0.2 MG/ML IJ SOLN
INTRAMUSCULAR | Status: DC | PRN
  Administered 2013-02-06: 0.2 mg via INTRAVENOUS

## 2013-02-06 MED ORDER — FENTANYL CITRATE 0.05 MG/ML IJ SOLN
INTRAMUSCULAR | Status: DC | PRN
  Administered 2013-02-06 (×4): 50 ug via INTRAVENOUS

## 2013-02-06 MED ORDER — HYDROMORPHONE HCL PF 1 MG/ML IJ SOLN
0.2500 mg | INTRAMUSCULAR | Status: DC | PRN
  Administered 2013-02-06 (×3): 0.5 mg via INTRAVENOUS

## 2013-02-06 MED ORDER — ROCURONIUM BROMIDE 100 MG/10ML IV SOLN
INTRAVENOUS | Status: DC | PRN
  Administered 2013-02-06: 30 mg via INTRAVENOUS

## 2013-02-06 MED ORDER — LIDOCAINE HCL (CARDIAC) 20 MG/ML IV SOLN
INTRAVENOUS | Status: DC | PRN
  Administered 2013-02-06: 80 mg via INTRAVENOUS

## 2013-02-06 MED ORDER — LIDOCAINE-EPINEPHRINE 1 %-1:100000 IJ SOLN
INTRAMUSCULAR | Status: AC
  Filled 2013-02-06: qty 1

## 2013-02-06 MED ORDER — HYDROMORPHONE HCL PF 1 MG/ML IJ SOLN
INTRAMUSCULAR | Status: AC
  Filled 2013-02-06: qty 1

## 2013-02-06 MED ORDER — PROPOFOL 10 MG/ML IV BOLUS
INTRAVENOUS | Status: DC | PRN
  Administered 2013-02-06: 200 mg via INTRAVENOUS

## 2013-02-06 MED ORDER — LACTATED RINGERS IV SOLN
INTRAVENOUS | Status: DC | PRN
  Administered 2013-02-06 (×2): via INTRAVENOUS

## 2013-02-06 MED ORDER — MIDAZOLAM HCL 5 MG/5ML IJ SOLN
INTRAMUSCULAR | Status: DC | PRN
  Administered 2013-02-06: 2 mg via INTRAVENOUS

## 2013-02-06 MED ORDER — OXYMETAZOLINE HCL 0.05 % NA SOLN
NASAL | Status: DC | PRN
  Administered 2013-02-06: 1

## 2013-02-06 MED ORDER — ONDANSETRON HCL 4 MG/2ML IJ SOLN
INTRAMUSCULAR | Status: DC | PRN
  Administered 2013-02-06: 4 mg via INTRAVENOUS

## 2013-02-06 MED ORDER — NEOSTIGMINE METHYLSULFATE 1 MG/ML IJ SOLN
INTRAMUSCULAR | Status: DC | PRN
  Administered 2013-02-06: 2 mg via INTRAVENOUS

## 2013-02-06 MED ORDER — 0.9 % SODIUM CHLORIDE (POUR BTL) OPTIME
TOPICAL | Status: DC | PRN
  Administered 2013-02-06: 1000 mL

## 2013-02-06 SURGICAL SUPPLY — 37 items
BLADE SURG 15 STRL LF DISP TIS (BLADE) ×2 IMPLANT
BLADE SURG 15 STRL SS (BLADE) ×1
CANISTER SUCTION 2500CC (MISCELLANEOUS) IMPLANT
CLOTH BEACON ORANGE TIMEOUT ST (SAFETY) ×3 IMPLANT
COVER SURGICAL LIGHT HANDLE (MISCELLANEOUS) ×3 IMPLANT
CRADLE DONUT ADULT HEAD (MISCELLANEOUS) ×3 IMPLANT
DECANTER SPIKE VIAL GLASS SM (MISCELLANEOUS) ×3 IMPLANT
ELECT COATED BLADE 2.86 ST (ELECTRODE) ×6 IMPLANT
GAUZE SPONGE 4X4 16PLY XRAY LF (GAUZE/BANDAGES/DRESSINGS) ×3 IMPLANT
GLOVE BIO SURGEON STRL SZ7.5 (GLOVE) ×3 IMPLANT
GLOVE BIOGEL M 6.5 STRL (GLOVE) ×3 IMPLANT
GLOVE BIOGEL PI IND STRL 6.5 (GLOVE) ×2 IMPLANT
GLOVE BIOGEL PI IND STRL 7.5 (GLOVE) ×2 IMPLANT
GLOVE BIOGEL PI INDICATOR 6.5 (GLOVE) ×1
GLOVE BIOGEL PI INDICATOR 7.5 (GLOVE) ×1
GLOVE ECLIPSE 7.5 STRL STRAW (GLOVE) ×3 IMPLANT
GLOVE SURG SS PI 7.0 STRL IVOR (GLOVE) ×3 IMPLANT
GOWN STRL NON-REIN LRG LVL3 (GOWN DISPOSABLE) ×9 IMPLANT
KIT BASIN OR (CUSTOM PROCEDURE TRAY) ×3 IMPLANT
KIT ROOM TURNOVER OR (KITS) ×3 IMPLANT
NEEDLE 27GAX1X1/2 (NEEDLE) ×3 IMPLANT
NS IRRIG 1000ML POUR BTL (IV SOLUTION) ×3 IMPLANT
PACK SURGICAL SETUP 50X90 (CUSTOM PROCEDURE TRAY) ×3 IMPLANT
PAD ARMBOARD 7.5X6 YLW CONV (MISCELLANEOUS) ×3 IMPLANT
PENCIL BUTTON HOLSTER BLD 10FT (ELECTRODE) ×6 IMPLANT
PLATE HYBRID GOLD MMF (Plate) ×2 IMPLANT
PLATE HYBRID MMF GOLD (Plate) ×4 IMPLANT
SCISSORS WIRE ANG 4 3/4 DISP (INSTRUMENTS) ×3 IMPLANT
SCREW LOCK SELFDRIL 2.0X8M MMF (Screw) ×24 IMPLANT
SUCTION FRAZIER TIP 10 FR DISP (SUCTIONS) IMPLANT
SUT CHROMIC 3 0 PS 2 (SUTURE) ×3 IMPLANT
SUT CHROMIC 4 0 PS 2 18 (SUTURE) ×3 IMPLANT
SYR BULB 3OZ (MISCELLANEOUS) ×3 IMPLANT
SYR CONTROL 10ML LL (SYRINGE) ×3 IMPLANT
TUBE CONNECTING 12X1/4 (SUCTIONS) ×6 IMPLANT
WATER STERILE IRR 1000ML POUR (IV SOLUTION) IMPLANT
YANKAUER SUCT BULB TIP NO VENT (SUCTIONS) ×3 IMPLANT

## 2013-02-06 NOTE — Progress Notes (Signed)
wirecutters at bedside 

## 2013-02-06 NOTE — Anesthesia Procedure Notes (Signed)
Procedure Name: Intubation Date/Time: 02/06/2013 7:51 AM Performed by: Jerilee Hoh Pre-anesthesia Checklist: Patient identified, Emergency Drugs available, Suction available and Patient being monitored Patient Re-evaluated:Patient Re-evaluated prior to inductionOxygen Delivery Method: Circle system utilized Preoxygenation: Pre-oxygenation with 100% oxygen Intubation Type: IV induction Ventilation: Mask ventilation without difficulty Laryngoscope Size: Mac and 4 Grade View: Grade I Nasal Tubes: Nasal Rae, Magill forceps- large, utilized, Nasal prep performed and Right Tube size: 7.5 mm Number of attempts: 1 Placement Confirmation: positive ETCO2,  ETT inserted through vocal cords under direct vision and breath sounds checked- equal and bilateral Tube secured with: Tape Dental Injury: Teeth and Oropharynx as per pre-operative assessment

## 2013-02-06 NOTE — Transfer of Care (Signed)
Immediate Anesthesia Transfer of Care Note  Patient: Kenneth Good  Procedure(s) Performed: Procedure(s): CLOSED REDUCTION MANDIBULAR FRACTURE (N/A) MAXILLARY MANDIBULAR FIXATION (N/A)  Patient Location: PACU  Anesthesia Type:General  Level of Consciousness: awake, alert , oriented and patient cooperative  Airway & Oxygen Therapy: Patient Spontanous Breathing and Patient connected to face mask oxygen  Post-op Assessment: Report given to PACU RN, Post -op Vital signs reviewed and stable and Patient moving all extremities  Post vital signs: Reviewed and stable  Complications: No apparent anesthesia complications

## 2013-02-06 NOTE — Anesthesia Preprocedure Evaluation (Addendum)
Anesthesia Evaluation  Patient identified by MRN, date of birth, ID band Patient awake    Reviewed: Allergy & Precautions, H&P , NPO status , Patient's Chart, lab work & pertinent test results  Airway Mallampati: II      Dental   Pulmonary neg pulmonary ROS,  breath sounds clear to auscultation        Cardiovascular hypertension, Rhythm:Regular Rate:Normal     Neuro/Psych    GI/Hepatic negative GI ROS, Neg liver ROS,   Endo/Other  negative endocrine ROS  Renal/GU negative Renal ROS     Musculoskeletal   Abdominal   Peds  Hematology   Anesthesia Other Findings   Reproductive/Obstetrics                          Anesthesia Physical Anesthesia Plan  ASA: I  Anesthesia Plan: General   Post-op Pain Management:    Induction: Intravenous  Airway Management Planned: Nasal ETT  Additional Equipment:   Intra-op Plan:   Post-operative Plan: Extubation in OR  Informed Consent: I have reviewed the patients History and Physical, chart, labs and discussed the procedure including the risks, benefits and alternatives for the proposed anesthesia with the patient or authorized representative who has indicated his/her understanding and acceptance.   Dental advisory given  Plan Discussed with: CRNA, Anesthesiologist and Surgeon  Anesthesia Plan Comments:        Anesthesia Quick Evaluation

## 2013-02-06 NOTE — Anesthesia Postprocedure Evaluation (Signed)
  Anesthesia Post-op Note  Patient: Kenneth Good  Procedure(s) Performed: Procedure(s): CLOSED REDUCTION MANDIBULAR FRACTURE (N/A) MAXILLARY MANDIBULAR FIXATION (N/A)  Patient Location: PACU  Anesthesia Type:General  Level of Consciousness: awake  Airway and Oxygen Therapy: Patient Spontanous Breathing  Post-op Pain: mild  Post-op Assessment: Post-op Vital signs reviewed  Post-op Vital Signs: Reviewed  Complications: No apparent anesthesia complications

## 2013-02-06 NOTE — H&P (Signed)
Kodi Steil is an 47 y.o. male.   Chief Complaint: jaw fx HPI: hx of bilateral condylar fx and left ramus nondisplaced. Here for surgery   Past Medical History  Diagnosis Date  . Alcohol abuse   . Hypertension     not current    Past Surgical History  Procedure Laterality Date  . Wisdom tooth extraction    . Laceration repair      arm    History reviewed. No pertinent family history. Social History:  reports that he has never smoked. He does not have any smokeless tobacco history on file. He reports that he drinks about 1.8 ounces of alcohol per week. He reports that he does not use illicit drugs.  Allergies: No Known Allergies  Medications Prior to Admission  Medication Sig Dispense Refill  . HYDROcodone-acetaminophen (HYCET) 7.5-325 mg/15 ml solution Take 15 mLs by mouth 4 (four) times daily as needed for pain.  120 mL  0  . Multiple Vitamin (MULTIVITAMIN WITH MINERALS) TABS Take 1 tablet by mouth daily.        Results for orders placed during the hospital encounter of 02/06/13 (from the past 48 hour(s))  CBC     Status: Abnormal   Collection Time    02/06/13  6:54 AM      Result Value Range   WBC 4.5  4.0 - 10.5 K/uL   RBC 4.44  4.22 - 5.81 MIL/uL   Hemoglobin 14.2  13.0 - 17.0 g/dL   HCT 16.1  09.6 - 04.5 %   MCV 96.2  78.0 - 100.0 fL   MCH 32.0  26.0 - 34.0 pg   MCHC 33.3  30.0 - 36.0 g/dL   RDW 40.9  81.1 - 91.4 %   Platelets 146 (*) 150 - 400 K/uL   No results found.  Review of Systems  Constitutional: Negative.   HENT: Negative.   Eyes: Negative.   Respiratory: Negative.   Cardiovascular: Negative.   Skin: Negative.     Blood pressure 143/101, pulse 80, temperature 98.4 F (36.9 C), temperature source Oral, resp. rate 20, height 5\' 10"  (1.778 m), weight 59.5 kg (131 lb 2.8 oz), SpO2 100.00%. Physical Exam  Constitutional: He appears well-developed.  HENT:  Malocclusion and poor dentition. Open bite deformity  Neck: Normal range of motion. Neck  supple.  Cardiovascular: Normal rate.   Respiratory: Effort normal.  GI: Soft.     Assessment/Plan Mandible fx- discussed the MMF and possibly open repair of the left ramus intraoral. He is ready to proceed  Suzanna Obey 02/06/2013, 7:26 AM

## 2013-02-06 NOTE — Op Note (Signed)
Preop/postop diagnoses: Mandible fracture Procedure: maxillary mandibular fixation and exploration of left ramus fracture Anesthesia: Gen. Estimated blood loss: Less than 10 cc Indications: 47 year old who's had a fall to his chin and sustained a bilateral condylar fractures that are displaced. He has a open bite deformity. He also has a nondisplaced fracture of the left ramus region which does not appear to be completely bicortical. Does go through a bad tooth on the left side. 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 He may always have some difficulty opening his mouth because of this situation. We discussed open repair of the condylar fractures which is generally makes the problem worse. He understands the maxillary mandibular fixation is to get his occlusion back into position and then a rubber band treatment will be subsequent. Exploration of the left fracture or put a plate on that since it is planned for him to be in fixation for 2 weeks will be made.we talked about his dental problems and bad teeth and for right now removing any teeth will be delayed and he will need to see a dentist for further evaluation. This does have a increased risk of infection from his mandible fracture. Risks, benefits, and options were discussed. All questions are answered and consent was obtained. Operation: Patient was taken to the operating room and after nasal endotracheal tube intubation was placed in the supine position. The arch bar plates were positioned and then to where the screws were positioned between roots. 4 screws were placed on the upper plate 2 on each side lining up the plate along the teeth. The lower plate was placed in the same fashion with 4 screws 2 on each side. Once these were positioned the occlusion was placed. The patient was pulled of  his mandible downward and allow the teeth canal positioned back in their normal occlusion. They seem to come together nicely. Wires were then placed 2 on each side securing his occlusion in its anatomic position. A decision was then made the left side laterally just at the level of the molar. This was dissected down to the bone and with Freer dissection the periosteum was taken off the bone and the area was explored. Going up toward the ramus/sub-condyle and down into the body anterior to the first molar there was no identified fracture. The jaw was moved to see if any fracture could be identified and there was no fracture that was evident so no plate was placed.the wound was irrigated and closed with a locking running 3-0 chromic. The patient was then awakened brought to cover stable condition counts correct

## 2013-02-06 NOTE — Progress Notes (Signed)
Report given to philip rn as caregiver 

## 2013-02-07 ENCOUNTER — Encounter (HOSPITAL_COMMUNITY): Payer: Self-pay | Admitting: Otolaryngology

## 2013-03-21 ENCOUNTER — Ambulatory Visit (HOSPITAL_COMMUNITY)
Admission: RE | Admit: 2013-03-21 | Discharge: 2013-03-21 | Disposition: A | Discharge status: Home / Self Care | Payer: Self-pay | Source: Ambulatory Visit | Attending: Otolaryngology | Admitting: Otolaryngology

## 2013-03-21 ENCOUNTER — Other Ambulatory Visit (HOSPITAL_COMMUNITY): Payer: Self-pay | Admitting: Otolaryngology

## 2013-03-21 DIAGNOSIS — R52 Pain, unspecified: Secondary | ICD-10-CM

## 2013-03-21 DIAGNOSIS — Z4789 Encounter for other orthopedic aftercare: Secondary | ICD-10-CM | POA: Insufficient documentation

## 2013-03-26 ENCOUNTER — Encounter (HOSPITAL_BASED_OUTPATIENT_CLINIC_OR_DEPARTMENT_OTHER): Payer: Self-pay | Admitting: *Deleted

## 2013-03-27 ENCOUNTER — Encounter (HOSPITAL_BASED_OUTPATIENT_CLINIC_OR_DEPARTMENT_OTHER): Payer: Self-pay | Admitting: *Deleted

## 2013-03-27 ENCOUNTER — Encounter (HOSPITAL_BASED_OUTPATIENT_CLINIC_OR_DEPARTMENT_OTHER)
Admission: RE | Disposition: A | Discharge status: Home / Self Care | Payer: Self-pay | Source: Ambulatory Visit | Attending: Otolaryngology

## 2013-03-27 ENCOUNTER — Ambulatory Visit (HOSPITAL_BASED_OUTPATIENT_CLINIC_OR_DEPARTMENT_OTHER)
Admission: RE | Admit: 2013-03-27 | Discharge: 2013-03-27 | Disposition: A | Discharge status: Home / Self Care | Payer: Self-pay | Source: Ambulatory Visit | Attending: Otolaryngology | Admitting: Otolaryngology

## 2013-03-27 ENCOUNTER — Ambulatory Visit (HOSPITAL_BASED_OUTPATIENT_CLINIC_OR_DEPARTMENT_OTHER): Payer: Self-pay | Admitting: Anesthesiology

## 2013-03-27 ENCOUNTER — Encounter (HOSPITAL_BASED_OUTPATIENT_CLINIC_OR_DEPARTMENT_OTHER): Payer: Self-pay | Admitting: Anesthesiology

## 2013-03-27 DIAGNOSIS — IMO0001 Reserved for inherently not codable concepts without codable children: Secondary | ICD-10-CM | POA: Insufficient documentation

## 2013-03-27 DIAGNOSIS — Z472 Encounter for removal of internal fixation device: Secondary | ICD-10-CM | POA: Insufficient documentation

## 2013-03-27 DIAGNOSIS — F1010 Alcohol abuse, uncomplicated: Secondary | ICD-10-CM | POA: Insufficient documentation

## 2013-03-27 DIAGNOSIS — I10 Essential (primary) hypertension: Secondary | ICD-10-CM | POA: Insufficient documentation

## 2013-03-27 HISTORY — PX: MANDIBULAR HARDWARE REMOVAL: SHX5205

## 2013-03-27 SURGERY — REMOVAL, HARDWARE, MANDIBLE
Anesthesia: General | Site: Mouth | Wound class: Clean Contaminated

## 2013-03-27 MED ORDER — FENTANYL CITRATE 0.05 MG/ML IJ SOLN: 50.0000 ug | INTRAMUSCULAR | Status: DC | PRN

## 2013-03-27 MED ORDER — ONDANSETRON HCL 4 MG/2ML IJ SOLN
INTRAMUSCULAR | Status: DC | PRN
  Administered 2013-03-27: 4 mg via INTRAVENOUS

## 2013-03-27 MED ORDER — LACTATED RINGERS IV SOLN
INTRAVENOUS | Status: DC
  Administered 2013-03-27: 08:00:00 via INTRAVENOUS

## 2013-03-27 MED ORDER — MIDAZOLAM HCL 5 MG/5ML IJ SOLN
INTRAMUSCULAR | Status: DC | PRN
  Administered 2013-03-27: 2 mg via INTRAVENOUS

## 2013-03-27 MED ORDER — FENTANYL CITRATE 0.05 MG/ML IJ SOLN: 50.0000 ug | Freq: Once | INTRAMUSCULAR | Status: DC

## 2013-03-27 MED ORDER — LIDOCAINE HCL (CARDIAC) 20 MG/ML IV SOLN
INTRAVENOUS | Status: DC | PRN
  Administered 2013-03-27: 75 mg via INTRAVENOUS

## 2013-03-27 MED ORDER — MIDAZOLAM HCL 2 MG/2ML IJ SOLN: 1.0000 mg | INTRAMUSCULAR | Status: DC | PRN

## 2013-03-27 MED ORDER — PROPOFOL 10 MG/ML IV BOLUS
INTRAVENOUS | Status: DC | PRN
  Administered 2013-03-27: 150 mg via INTRAVENOUS

## 2013-03-27 SURGICAL SUPPLY — 21 items
BLADE SURG 15 STRL LF DISP TIS (BLADE) ×1 IMPLANT
BLADE SURG 15 STRL SS (BLADE) ×1
CANISTER SUCTION 1200CC (MISCELLANEOUS) ×2 IMPLANT
CLOTH BEACON ORANGE TIMEOUT ST (SAFETY) ×2 IMPLANT
COVER MAYO STAND STRL (DRAPES) ×2 IMPLANT
DECANTER SPIKE VIAL GLASS SM (MISCELLANEOUS) ×2 IMPLANT
GLOVE ECLIPSE 6.5 STRL STRAW (GLOVE) ×2 IMPLANT
GLOVE SS BIOGEL STRL SZ 7.5 (GLOVE) ×1 IMPLANT
GLOVE SUPERSENSE BIOGEL SZ 7.5 (GLOVE) ×1
GOWN PREVENTION PLUS XLARGE (GOWN DISPOSABLE) ×4 IMPLANT
MARKER SKIN DUAL TIP RULER LAB (MISCELLANEOUS) IMPLANT
NEEDLE 27GAX1X1/2 (NEEDLE) IMPLANT
PACK BASIN DAY SURGERY FS (CUSTOM PROCEDURE TRAY) ×2 IMPLANT
SCISSORS WIRE ANG 4 3/4 DISP (INSTRUMENTS) IMPLANT
SHEET MEDIUM DRAPE 40X70 STRL (DRAPES) ×2 IMPLANT
SUT CHROMIC 3 0 PS 2 (SUTURE) IMPLANT
SUT CHROMIC 4 0 PS 2 18 (SUTURE) IMPLANT
SYR CONTROL 10ML LL (SYRINGE) IMPLANT
TOWEL OR 17X24 6PK STRL BLUE (TOWEL DISPOSABLE) ×4 IMPLANT
TUBE CONNECTING 20X1/4 (TUBING) ×2 IMPLANT
YANKAUER SUCT BULB TIP NO VENT (SUCTIONS) ×2 IMPLANT

## 2013-03-27 NOTE — H&P (Signed)
Kenneth Good is an 47 y.o. male.   Chief Complaint: jaw fx  HPI: hx of orif for mandible fx. Now needs hardware off.  Past Medical History  Diagnosis Date  . Alcohol abuse     Past Surgical History  Procedure Laterality Date  . Wisdom tooth extraction    . Laceration repair      arm  . Closed reduction mandible N/A 02/06/2013    Procedure: CLOSED REDUCTION MANDIBULAR FRACTURE;  Surgeon: Suzanna Obey, MD;  Location: [facility] OR;  Service: ENT;  Laterality: N/A;  . Orif mandibular fracture N/A 02/06/2013    Procedure: MAXILLARY MANDIBULAR FIXATION;  Surgeon: Suzanna Obey, MD;  Location: [facility] OR;  Service: ENT;  Laterality: N/A;    History reviewed. No pertinent family history. Social History:  reports that he has never smoked. He does not have any smokeless tobacco history on file. He reports that he does not drink alcohol or use illicit drugs.  Allergies: No Known Allergies  Medications Prior to Admission  Medication Sig Dispense Refill  . acetaminophen (TYLENOL) 160 MG/5ML suspension Take 15 mg/kg by mouth every 4 (four) hours as needed for fever.      . feeding supplement (BOOST HIGH PROTEIN) LIQD Take 1 Container by mouth 3 (three) times daily between meals.      . Multiple Vitamin (MULTIVITAMIN WITH MINERALS) TABS Take 1 tablet by mouth daily.        No results found for this or any previous visit (from the past 48 hour(s)). No results found.  Review of Systems  Constitutional: Negative.   HENT: Negative.   Eyes: Negative.   Respiratory: Negative.   Cardiovascular: Negative.   Skin: Negative.     Blood pressure 128/90, pulse 75, temperature 97.8 F (36.6 C), temperature source Oral, resp. rate 16, height 5\' 11"  (1.803 m), weight 56.881 kg (125 lb 6.4 oz), SpO2 99.00%. Physical Exam  Constitutional: He appears well-developed.  HENT:  Nose: Nose normal.  Eyes: Conjunctivae are normal. Pupils are equal, round, and reactive to light.  Neck: Normal range of motion. Neck supple.   Cardiovascular: Normal rate.   Respiratory: Effort normal.  GI: Soft.     Assessment/Plan Mandible fx- ready to remove hardware  Suzanna Obey 03/27/2013, 8:37 AM

## 2013-03-27 NOTE — Anesthesia Preprocedure Evaluation (Addendum)
Anesthesia Evaluation  Patient identified by MRN, date of birth, ID band Patient awake    Reviewed: Allergy & Precautions, H&P , NPO status , Patient's Chart, lab work & pertinent test results  Airway Mallampati: II TM Distance: >3 FB Neck ROM: Full    Dental   Pulmonary neg pulmonary ROS,  breath sounds clear to auscultation        Cardiovascular hypertension, Rhythm:Regular Rate:Normal     Neuro/Psych    GI/Hepatic negative GI ROS, Neg liver ROS, (+)     substance abuse  alcohol use,   Endo/Other  negative endocrine ROS  Renal/GU negative Renal ROS     Musculoskeletal   Abdominal   Peds  Hematology   Anesthesia Other Findings   Reproductive/Obstetrics                          Anesthesia Physical Anesthesia Plan  ASA: III  Anesthesia Plan: General   Post-op Pain Management:    Induction: Intravenous  Airway Management Planned: Mask  Additional Equipment:   Intra-op Plan:   Post-operative Plan: Extubation in OR  Informed Consent: I have reviewed the patients History and Physical, chart, labs and discussed the procedure including the risks, benefits and alternatives for the proposed anesthesia with the patient or authorized representative who has indicated his/her understanding and acceptance.     Plan Discussed with: CRNA and Surgeon  Anesthesia Plan Comments:       Anesthesia Quick Evaluation

## 2013-03-27 NOTE — Anesthesia Postprocedure Evaluation (Signed)
  Anesthesia Post-op Note  Patient: Kenneth Good  Procedure(s) Performed: Procedure(s): MANDIBULAR HARDWARE REMOVAL AND ARCH BAR REMOVAL  (N/A)  Patient Location: PACU  Anesthesia Type:General  Level of Consciousness: awake  Airway and Oxygen Therapy: Patient Spontanous Breathing  Post-op Pain: mild  Post-op Assessment: Post-op Vital signs reviewed, Patient's Cardiovascular Status Stable, Respiratory Function Stable, Patent Airway, No signs of Nausea or vomiting and Pain level controlled  Post-op Vital Signs: stable  Complications: No apparent anesthesia complications

## 2013-03-27 NOTE — Op Note (Signed)
Preop/postop diagnosis: Mandible fracture with hardware Procedure: Removal of hardware Anesthesia: Sedation Estimated blood loss approximately 5 cc Indications: 47 year old with a history of the mandible fracture and now is 6 weeks out. He has seemed to do well. He has no pain. His occlusion seems to be good. It is now time for removal of his arch bars. He was informed a risk and benefits of the procedure and options were discussed all questions are answered and consent was obtained. Operation: Patient was taken to the operating room placed in the supine position after sedation with anesthesia the arch bars upper and lower were removed by removing the screws. They came off easily. He had a minimal amount of bleeding which was controlled easily. His teeth were in very poor repair. He tolerated the procedure well. He was awake and brought to cover stable condition counts correct

## 2013-03-27 NOTE — Transfer of Care (Signed)
Immediate Anesthesia Transfer of Care Note  Patient: Kenneth Good  Procedure(s) Performed: Procedure(s): MANDIBULAR HARDWARE REMOVAL AND ARCH BAR REMOVAL  (N/A)  Patient Location: PACU  Anesthesia Type:MAC  Level of Consciousness: awake, alert  and oriented  Airway & Oxygen Therapy: Patient Spontanous Breathing and Patient connected to face mask oxygen  Post-op Assessment: Report given to PACU RN and Post -op Vital signs reviewed and stable  Post vital signs: Reviewed and stable  Complications: No apparent anesthesia complications

## 2013-03-27 NOTE — Anesthesia Procedure Notes (Signed)
Procedure Name: MAC Date/Time: 03/27/2013 8:54 AM Performed by: Zenia Resides D Pre-anesthesia Checklist: Patient identified, Timeout performed, Emergency Drugs available, Suction available and Patient being monitored Patient Re-evaluated:Patient Re-evaluated prior to inductionOxygen Delivery Method: Simple face mask Intubation Type: IV induction

## 2013-03-28 ENCOUNTER — Encounter (HOSPITAL_BASED_OUTPATIENT_CLINIC_OR_DEPARTMENT_OTHER): Payer: Self-pay | Admitting: Otolaryngology

## 2013-04-02 NOTE — Addendum Note (Signed)
Addended by: Leonette Most on: 04/02/2013 06:24 PM   Modules accepted: Orders

## 2013-07-05 ENCOUNTER — Ambulatory Visit (HOSPITAL_COMMUNITY)
Admission: RE | Admit: 2013-07-05 | Discharge: 2013-07-05 | Disposition: A | Discharge status: Home / Self Care | Payer: Self-pay | Attending: Psychiatry | Admitting: Psychiatry

## 2013-07-05 NOTE — BH Assessment (Signed)
Assessment Note  Kenneth Good is an 47 y.o. male presenting to [facility] as a walk in. Sts he has a 2 yr history of alcoholism. He reports that over the past 2 yrs he has drank heavily. He reports drinking 12-20 beers per day. During the 2 yrs he did receive detox 1x in the ER at [facility] approx. March of this year. Sts he was their for 6 days. Patient resumed drinking 2-3 months later after a accident. Sts that he was in pain and the pain pills were not enough. Pt used alcohol to self medicate. Patient relapsed in July of this year. Since resuming his drinking he has continued to drink 12-20 beers. However, patient suddenly stopped 9 days ago. Sts that he has not had a craving or desire to drink. Pt presents today asking for further help.   Pt referred to the CD-IOP program and will follow up with Daneil Dolin for a start date.    Axis I: Alcohol Abuse Axis II: Deferred Axis III:  Past Medical History  Diagnosis Date  . Alcohol abuse    Axis IV: other psychosocial or environmental problems, problems related to social environment, problems with access to health care services and problems with primary support group Axis V: 31-40 impairment in reality testing  Past Medical History:  Past Medical History  Diagnosis Date  . Alcohol abuse     Past Surgical History  Procedure Laterality Date  . Wisdom tooth extraction    . Laceration repair      arm  . Closed reduction mandible N/A 02/06/2013    Procedure: CLOSED REDUCTION MANDIBULAR FRACTURE;  Surgeon: Suzanna Obey, MD;  Location: [facility] OR;  Service: ENT;  Laterality: N/A;  . Orif mandibular fracture N/A 02/06/2013    Procedure: MAXILLARY MANDIBULAR FIXATION;  Surgeon: Suzanna Obey, MD;  Location: [facility] OR;  Service: ENT;  Laterality: N/A;  . Mandibular hardware removal N/A 03/27/2013    Procedure: MANDIBULAR HARDWARE REMOVAL AND ARCH BAR REMOVAL ;  Surgeon: Suzanna Obey, MD;  Location: [facility];  Service: ENT;  Laterality: N/A;    Family  History: No family history on file.  Social History:  reports that he has never smoked. He does not have any smokeless tobacco history on file. He reports that he does not drink alcohol or use illicit drugs.  Additional Social History:  Alcohol / Drug Use Pain Medications: None reported Prescriptions: None reported  Over the Counter: None reported  History of alcohol / drug use?: Yes Longest period of sobriety (when/how long): 9 days of sobriety Substance #1 Name of Substance 1: Alcohol  1 - Age of First Use: 47 yrs old  1 - Amount (size/oz): 12-20 beers 1 - Frequency: daily for the past 2 yrs with various periods of sobriety  1 - Duration: ongoing  1 - Last Use / Amount: 9 days ago  CIWA:   COWS:    Allergies: No Known Allergies  Home Medications:  (Not in a hospital admission)  OB/GYN Status:  No LMP for male patient.  General Assessment Data Location of Assessment: [facility] Is this a Tele or Face-to-Face Assessment?: Face-to-Face Is this an Initial Assessment or a Re-assessment for this encounter?: Initial Assessment Living Arrangements: Other (Comment) (pt lives alone) Can pt return to current living arrangement?: Yes Admission Status: Voluntary Is patient capable of signing voluntary admission?: Yes Transfer from: Acute Hospital Referral Source: Self/Family/Friend     [facility] Crisis Care Plan Living Arrangements: Other (Comment) (pt  lives alone) Name of Psychiatrist:  (No psychiatrist ) Name of Therapist:  (No therapist )  Education Status Is patient currently in school?: No  Risk to self Suicidal Ideation: No Suicidal Intent: No Is patient at risk for suicide?: No Suicidal Plan?: No Access to Means: No What has been your use of drugs/alcohol within the last 12 months?:  (n/a) Previous Attempts/Gestures: No How many times?:  (n/a) Other Self Harm Risks:  (n/a) Triggers for Past Attempts: Other (Comment) Intentional Self Injurious Behavior: None Family  Suicide History: No Recent stressful life event(s): Legal Issues Persecutory voices/beliefs?: No Depression: No Substance abuse history and/or treatment for substance abuse?: No Suicide prevention information given to non-admitted patients: Not applicable  Risk to Others Homicidal Ideation: No Thoughts of Harm to Others: No Current Homicidal Intent: No Current Homicidal Plan: No Access to Homicidal Means: No Identified Victim:  (n/a) History of harm to others?: No Assessment of Violence: None Noted Violent Behavior Description:  (patient is calm and cooperative) Does patient have access to weapons?: No Criminal Charges Pending?: Yes Describe Pending Criminal Charges:  (DUI) Does patient have a court date: Yes Court Date:  (patient unable to recall court date)  Psychosis Hallucinations: None noted Delusions: None noted  Mental Status Report Appear/Hygiene: Disheveled Eye Contact: Fair Motor Activity: Freedom of movement Speech: Logical/coherent Level of Consciousness: Alert Mood: Other (Comment) (appropriate ) Affect: Appropriate to circumstance Anxiety Level: Moderate Thought Processes: Coherent;Relevant Judgement: Unimpaired Orientation: Person;Time;Place;Situation Obsessive Compulsive Thoughts/Behaviors: None  Cognitive Functioning Concentration: Normal Memory: Recent Intact;Remote Intact IQ: Average Insight: Good Impulse Control: Good Appetite: Good Weight Loss:  (none reported) Weight Gain:  (none reported) Sleep: No Change Total Hours of Sleep:  (varies) Vegetative Symptoms: None  ADLScreening [facility] Assessment Services) Patient's cognitive ability adequate to safely complete daily activities?: Yes Patient able to express need for assistance with ADLs?: Yes Independently performs ADLs?: Yes (appropriate for developmental age)  Prior Inpatient Therapy Prior Inpatient Therapy: No Prior Therapy Dates:  (n/a) Prior Therapy Facilty/Provider(s):   (n/a) Reason for Treatment:  (n/a)  Prior Outpatient Therapy Prior Outpatient Therapy: No Prior Therapy Dates:  (n/a) Prior Therapy Facilty/Provider(s):  (n/a) Reason for Treatment: n/a  ADL Screening (condition at time of admission) Patient's cognitive ability adequate to safely complete daily activities?: Yes Is the patient deaf or have difficulty hearing?: No Does the patient have difficulty seeing, even when wearing glasses/contacts?: No Does the patient have difficulty concentrating, remembering, or making decisions?: No Patient able to express need for assistance with ADLs?: Yes Does the patient have difficulty dressing or bathing?: No Independently performs ADLs?: Yes (appropriate for developmental age) Communication: Independent Dressing (OT): Independent Grooming: Independent Feeding: Independent Bathing: Independent Toileting: Independent In/Out Bed: Independent Walks in Home: Independent Does the patient have difficulty walking or climbing stairs?: No Weakness of Legs: None Weakness of Arms/Hands: None  Home Assistive Devices/Equipment Home Assistive Devices/Equipment: None    Abuse/Neglect Assessment (Assessment to be complete while patient is alone) Physical Abuse: Denies Verbal Abuse: Denies Sexual Abuse: Denies Exploitation of patient/patient's resources: Denies Self-Neglect: Denies Values / Beliefs Cultural Requests During Hospitalization: None Spiritual Requests During Hospitalization: None   Advance Directives (For Healthcare) Advance Directive: Patient does not have advance directive Nutrition Screen- [facility] Adult/[facility] Patient's home diet: Regular  Additional Information 1:1 In Past 12 Months?: No CIRT Risk: No Elopement Risk: No Does patient have medical clearance?: Yes     Disposition:  Disposition Initial Assessment Completed for this Encounter: Yes Disposition of Patient: Other  dispositions;Outpatient treatment (CD-IOP) Type of  outpatient treatment: Chemical Dependence - Intensive Outpatient Other disposition(s): Other (Comment) ( follow up with Daneil Dolin 07/07/13 to request start date)  On Site Evaluation by:   Reviewed with Physician:    Melynda Ripple [facility] 07/05/2013 2:36 PM

## 2013-07-09 ENCOUNTER — Other Ambulatory Visit (HOSPITAL_COMMUNITY): Payer: No Typology Code available for payment source | Attending: Psychiatry

## 2013-07-09 DIAGNOSIS — F1020 Alcohol dependence, uncomplicated: Secondary | ICD-10-CM | POA: Insufficient documentation

## 2013-07-10 ENCOUNTER — Other Ambulatory Visit (HOSPITAL_COMMUNITY): Payer: Self-pay

## 2013-07-11 ENCOUNTER — Other Ambulatory Visit (HOSPITAL_COMMUNITY): Payer: No Typology Code available for payment source | Admitting: Psychology

## 2013-07-11 DIAGNOSIS — F10239 Alcohol dependence with withdrawal, unspecified: Secondary | ICD-10-CM

## 2013-07-11 NOTE — Progress Notes (Signed)
Psychiatric Assessment Child/Adolescent  Patient Identification:  Kenneth Good Date of Evaluation:  07/11/2013 Chief Complaint:  Chronic Alcohol Dependence History of Chief Complaint:  No chief complaint on file.   HPIPt took first drink age 47 and had one of the best experiences ever of his life-he threw up next day but the experience left an indelible emotional memory.His maternal Grandmother "drank herself to death". His mother was a Engineer, civil (consulting) at [employer] for 30 years.States he "knew" 4-5 yrs ago he would "end up in detox" which occurred at [facility] ED over a 6 day period March 17-23,2014-Stayed sober and attended AA until August when he went golfing with friends and thought "one beer wont hurt".(He admits he NEVER drank 1 beer in his life) Review of SystemsNonconrtiburory except for residual jaw pain and need for further surgery to repair damage from fall Physical Exam Thin Grossly WNL No tremor/signs of W/D   Mood Symptoms:  Sleep,trouble staying asleep-wants to manage without meds for now  (Hypo) Manic Symptoms: Elevated Mood:  No Irritable Mood:  No Grandiosity:  No Distractibility:  No Labiality of Mood:  No Delusions:  No Hallucinations:  No Impulsivity:  No Sexually Inappropriate Behavior:  No Financial Extravagance:  No Flight of Ideas:  No  Anxiety Symptoms: Excessive Worry:  No Panic Symptoms:  No Agoraphobia:  No Obsessive Compulsive: No  Symptoms: None, Specific Phobias:  No Social Anxiety:  No  Psychotic Symptoms:  Hallucinations: No None Delusions:  No Paranoia:  No   Ideas of Reference:  No  PTSD Symptoms: Ever had a traumatic exposure:  Yes Fall with severe facial/jaw injuries July 7 requiring multilp````````````````````````````````````````````````````````````````````````````````````````````````````````````````````````````````````````````````````````` Had a traumatic exposure in the last month:  No Re-experiencing: No None Hypervigilance:  No Hyperarousal: No  None Avoidance: Yes None  Traumatic Brain Injury: No none-Jaw only injured in fall  Past Psychiatric History: Diagnosis:Alcoholism:  Detox [facility] ED 3/13-20 ,201Sober 3/13 to August ?2014-attended AA  Hospitalizations:  As Roanna Raider Jaw July 7 and surgical repairs since  Outpatient Care:  Started BHIOP 3 Days ago/AA meetings   Substance Abuse Care:  As noted  Self-Mutilation:  NA  Suicidal Attempts:  NA  Violent Behaviors:  NA   Past Medical History:   Past Medical History  Diagnosis Date  . Alcohol abuse    History of Loss of Consciousness:  Yes Seizure History:  No Cardiac History:  Negative Allergies:  No Known Allergies Current Medications:  Current Outpatient Prescriptions  Medication Sig Dispense Refill  . acetaminophen (TYLENOL) 160 MG/5ML suspension Take 15 mg/kg by mouth every 4 (four) hours as needed for fever.      . feeding supplement (BOOST HIGH PROTEIN) LIQD Take 1 Container by mouth 3 (three) times daily between meals.      . Multiple Vitamin (MULTIVITAMIN WITH MINERALS) TABS Take 1 tablet by mouth daily.       No current facility-administered medications for this visit.    Previous Psychotropic Medications:  Medication Dose   None  NA                     Substance Abuse History in the last 12 months: Substance Age of 1st Use Last Use Amount Specific Type  Nicotine 0 NA NA NA  Alcohol 17 Nov 27 15-18 Beers  Cannabis  [age] ? Smoke  Opiates 47 06/19/2013 3 tablets daily Oxycodone 5 mg RX  Cocaine [age]  ? Powder  Methamphetamines 0 NA NA NA  LSD 0 NA  NA NA  Ecstasy 1 NA NA NA  Benzodiazepines [age] 1 hs VALIUM RX  Caffeine [age] 2-3 CUPS COFFEE  Inhalants 0 NA NA NA  Others: NA NA NA NA                     Medical Consequences of Substance Abuse: ALCOHOLIC HEPATITIS  Legal Consequences of Substance Abuse: DUI PENDING COURT JANUARY  Family Consequences of Substance Abuse: Mother ACOA-Doesn't talk about childhood-PT NOT GOING TO MOTHER'S  FOR XMAS BECAUSE "IT IS JUST A DRINKING FEST"  Blackouts:  Yes DT's:  No Withdrawal Symptoms: Yes Diaphoresis Tremors  Social History: Current Place of Residence: Rents Apt Place of Birth:  11-11-1965 [place] Family Members: Mother/Father living remarried Children: None  Sons: na  Daughters: na Relationships: 1Page Brother 42  Developmental History: Prenatal History: NA Birth History: NA Postnatal Infancy: NA Developmental History: NA Milestones:  Sit-Up: NA  Crawl: NA  Walk: NA  Speech: NA School History:   [employer]/3 yrs [employer]-going to attend [employer] Legal History: DUI pending-pt has attorney/case will be postponed until he completes treatment. Hobbies/Interests: Golf/Sports/Computers  Family History:  No family history on file.adult Grandchild of alcoholic  Mental Status Examination/Evaluation: Objective:  Appearance: Well Groomed  Patent attorney::  Good  Speech:  Clear and Coherent  Volume:  Normal  Mood:  EUTHYMIC  Affect:  Congruent  Thought Process:  Goal Directed  Orientation:  Full (Time, Place, and Person)  Thought Content:  WDL  Suicidal Thoughts:  No  Homicidal Thoughts:  No  Judgement:  Intact  Insight:  Good  Psychomotor Activity:  Normal  Akathisia:  NA  Handed:  Right  AIMS (if indicated):  NA  Assets:  Desire for Improvement    Laboratory/X-Ray Psychological Evaluation(s)   sEE rESULTS REVIEW-lftS ^ 3/13  SEE IOP intake   Assessment:  ALCOHOL DEPENDENCE IN VERY EARLY REMISSION   AXIS I ALCOHOL DEPENDENCE IN EARLY REMISSION  AXIS II Deferred  AXIS III Past Medical History  Diagnosis Date  . Alcohol abuse   ALCOHOLIC HEPATITIS  AXIS IV problems with primary support group  AXIS V 41-50 serious symptoms   Treatment Plan/Recommendations:  Plan of Care: BHIOP  Laboratory:  NO FURTHER LABS -PT HAS HAD FOR SURGERY AS WELL AS ED LABS IN MARCH  Psychotherapy:  BHIOP GROUP  Medications:  AS NOTED  Routine PRN  Medications:  No  Consultations:  NA  Safety Concerns:  NONE  Other: PT WILL REPORT IF SLEEP PROBLEM PERSISITS     [facility] 12/12/20143:08 PM

## 2013-07-14 ENCOUNTER — Other Ambulatory Visit (HOSPITAL_COMMUNITY): Payer: Self-pay | Admitting: Psychology

## 2013-07-14 DIAGNOSIS — F1020 Alcohol dependence, uncomplicated: Secondary | ICD-10-CM

## 2013-07-15 ENCOUNTER — Other Ambulatory Visit (HOSPITAL_COMMUNITY): Payer: Self-pay

## 2013-07-15 ENCOUNTER — Encounter (HOSPITAL_COMMUNITY): Payer: Self-pay | Admitting: Psychology

## 2013-07-15 DIAGNOSIS — F1020 Alcohol dependence, uncomplicated: Secondary | ICD-10-CM | POA: Insufficient documentation

## 2013-07-15 NOTE — Progress Notes (Signed)
    Daily Group Progress Note  Program: CD-IOP   Group Time: 1:00-2:30pm  Participation Level: Minimal  Behavioral Response: Appropriate  Type of Therapy: Process Group  Topic: Group members checked in by sharing their names and sobriety dates.  Counselor invited group members to share what was on their minds as they wanted, and the group discussed issues such as problems with relationships and living arrangements, losses of "self" due to drug use, and developing spirituality and trust in a Higher Power.  The group also supported one member who had been in a car accident earlier that day.  At the end of the group session, members shared their weekend plans to insure that everyone had a plan to stay sober.     Group Time: 2:45-4:00  Participation Level: Active  Behavioral Response: Appropriate  Type of Therapy: Psycho-education Group  Topic: Counselor led a discussion on forgiveness.  Group members read the poem "Anyway" by Mother Aggie Cosier and shared the personal meaning that they took from it.  They also reviewed a handout on forgiveness, which defined forgiveness and described an exercise on practicing forgiveness.  Group members discussed times when they had forgiven others, the difficulties of forgiving, self-forgiveness, and using Step 4 (creating a moral inventory) to identify things they needed to be forgiven for.     Summary: Patient reported a sobriety date of 11/28 and shared that he had attended a meeting at the [employer] to support his recovery.  He also reported that he had joined a new church and was looking into attending meetings there in addition to worship services.  During the psychoeducation group, he responded positively to the "Anyway" poem.  He shared that his weekend plans were to attend a men's meeting on Saturday and church services on Sunday.   Family Program: Family present? No   Name of family member(s): n/a  UDS collected: No Results: n/a  AA/NA  attended?: Yes Thursday  Sponsor?: Yes   [facility]

## 2013-07-15 NOTE — Progress Notes (Signed)
    Daily Group Progress Note  Program: CD-IOP   Group Time: 1-2:30 pm  Participation Level: Active  Behavioral Response: Appropriate and Sharing  Type of Therapy: Process Group  Topic: Group Process: the first part of group was spent in  process. Members shared about the past weekend and any successes or challenges they have had in early recovery. A new group member was present and she shared a little about herself and what she hopes to get from the group.   Group Time: 2:45-4 pm  Participation Level: Minimal  Behavioral Response: Appropriate and Sharing  Type of Therapy: Psycho-education Group  Topic: Rational-Emotive Therapy/Cognitive Distortions: the second half of group was spent in a psycho-ed presentation. Handouts were provided to group members outlining how we come to experience emotional disturbances. The take home message was that all events are followed closely by one's interpretation of them, followed by the emotional consequences or responses of that interpretation. Examples were listed on the board and members were challenged to identify different ways one could interpret an event. Also included was a handout out on Cognitive Distortions with members identifying things they tend to do that are, actually, 'twisted or distorted forms of thinking. The session proved engaging with members sharing openly.   Summary: The patient reported he was doing well and he has remained sober since November 11/28. He reported he has to be careful about being around triggers and old drinking buddies. The patient was attentive, but quiet for much of the session. He laughed as other group members described their tendencies to magnify problems and exclude or ignore anything good that is happening in their lives. The patient brought the documentation I had requested he complete and it will be faxed to the proper people. He is learning about recovery and this is the first time he has ever had any  formal treatment beyond detox. His sobriety date remains 11/28.     Family Program: Family present? No   Name of family member(s):   UDS collected: No Results:   AA/NA attended?: YesMonday, Saturday and Sunday  Sponsor?: Yes   Samika Vetsch, LCAS

## 2013-07-16 ENCOUNTER — Encounter (HOSPITAL_COMMUNITY): Payer: Self-pay | Admitting: Psychology

## 2013-07-16 ENCOUNTER — Other Ambulatory Visit (HOSPITAL_COMMUNITY): Payer: No Typology Code available for payment source | Admitting: Psychology

## 2013-07-16 DIAGNOSIS — F1020 Alcohol dependence, uncomplicated: Secondary | ICD-10-CM

## 2013-07-17 ENCOUNTER — Encounter (HOSPITAL_COMMUNITY): Payer: Self-pay | Admitting: Psychology

## 2013-07-17 ENCOUNTER — Other Ambulatory Visit (HOSPITAL_COMMUNITY): Payer: Self-pay

## 2013-07-17 NOTE — Progress Notes (Signed)
    Daily Group Progress Note  Program: CD-IOP   Group Time: 1-2:30 pm  Participation Level: Active  Behavioral Response: Appropriate and Sharing  Type of Therapy: Process Group  Topic: Group Process: first part of group was spent in process. Members shared about current issues and concerns that they are dealing with in early recovery. One member was very different today and he admitted he was struggling and withdrawing from his recovery support group. Some time was spent providing feedback from fellow group members and assisting this member in considering different options to address upcoming challenges. Prior to taking a break, a member who had arrived late was asked to share about herself. The disclosure requested including information about drug-seeking that had resulted in a felony, which she had failed to disclose to me when she first entered the program. The session was insightful and informative for all present.  Group Time: 2:45- 4pm  Participation Level: Minimal  Behavioral Response: Appropriate and Sharing  Type of Therapy: Psycho-education Group  Topic: Wheel of Life: the second half of group was spent in a psycho-ed piece called "The Wheel of Life". members were asked to chart the place they are relative to 8 basic categories of life. One by one, members came to the board and drew where they were in each category. They explained their 'positions' to their fellow group members, including where they had been while in their active addiction, and where they hope to be. The group responded well to this intervention.  Summary: The patient reported he was doing well. He expressed frustration as he described  Attending the [employer] basketball game last night. He had thought it would be a very safe drug-free place, but the first thing he saw when he walked into the coliseum was a big sign for [employer]. He avoided it for the rest of the evening, but he admitted it was a challenge. The  patient reported he has not had any cravings and feels good about himself. The patient shared little of himself in the second part of group, but he provided good feedback and made some very good comments. He is doing very well in the few sessions he has attended. The patient's sobriety date remains 11/28.  Family Program: Family present? No   Name of family member(s):   UDS collected: Yes Results: not returned from lab  AA/NA attended?: [place]  Sponsor?: No, but he is seeking one   Venancio Chenier, LCAS

## 2013-07-17 NOTE — Progress Notes (Unsigned)
Patient ID: Kenneth Good, male   DOB: 04-Mar-1966, 47 y.o.   MRN: [id number] CD-IOP: Treatment Planning Session. The patient met with me for his first individual therapy session this morning before his group session. He has been very pleasant and open about his drinking in the few group sessions he had attended. Despite 30 years of drinking, he has never had any formal treatment beyond detox and he is very motivated to learn how he can stay sober. I was very pleased to hand him a copy of the scholarship approval and explained he will have free medical treatment available to him for the next 6 months. 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 The patient was very excited and grateful for this incredible gift. I explained the need for goals and he agreed that sobriety and building support for his recovery are the two most important ones. 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 He would really like to work at [employer] and knows somebody who works there. He agreed he has already contacted them and will continue to seek an opportunity for an interview. The patient also noted he has that pending DUI. He was arrested and charged with a DUI and it is still pending. He had refused to blow and despite being taken down to the magistrate and had blood drawn, there had never been any other actions taken. His attorney is working on the case and I explained the need for an assessment if and when the attorney feels it will be needed. He is hoping they will throw it out. The documentation was reviewed, signatures obtained, and the treatment plan completed accordingly. The patient reported his father lives here in [place] out near [facility]. He first played golf at age 53 and has loved the game ever  since. His family is supportive of his treatment, but his father and brother are both big drinkers and he has to avoid them most of the time or he will be very tempted. The patient is engaged in the 12-step community and has been instructed to secure a sponsor. He sold his car since he couldn't drive and is currently traveling by bus for all of his engagements. The bus drops him off just across the street at [facility]. The patient is not taking any medications nor does he want any at this time. We will continue to follow closely in the days ahead.

## 2013-07-18 ENCOUNTER — Other Ambulatory Visit (HOSPITAL_COMMUNITY): Payer: No Typology Code available for payment source

## 2013-07-18 DIAGNOSIS — F1021 Alcohol dependence, in remission: Secondary | ICD-10-CM

## 2013-07-18 NOTE — Progress Notes (Unsigned)
Patient ID: Kenneth Good, male   DOB: 10/05/1965, 47 y.o.   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

## 2013-07-21 ENCOUNTER — Other Ambulatory Visit (HOSPITAL_COMMUNITY): Payer: No Typology Code available for payment source | Admitting: Psychology

## 2013-07-21 DIAGNOSIS — F1020 Alcohol dependence, uncomplicated: Secondary | ICD-10-CM

## 2013-07-22 ENCOUNTER — Encounter (HOSPITAL_COMMUNITY): Payer: Self-pay | Admitting: Psychology

## 2013-07-22 ENCOUNTER — Other Ambulatory Visit (HOSPITAL_COMMUNITY): Payer: Self-pay

## 2013-07-22 NOTE — Progress Notes (Signed)
    Daily Group Progress Note  Program: CD-IOP   Group Time: 1-2:30 pm  Participation Level: Active  Behavioral Response: Appropriate and Sharing  Type of Therapy: Process Group  Topic: Group Process: The first half of group was spent in process. Members shared about the past weekend and any struggles or issues that may have appeared in early recovery. Most of the members had attended 12-step meetings, but at least two members did not go to any meetings. Other members shared why they attend so many meetings and the goal of making attendance a daily ritual is one reason behind going to so many meetings.  The session went well with good disclosure and feedback among members.  Group Time: 2:45- 4pm  Participation Level: Active  Behavioral Response: Sharing  Type of Therapy: Psycho-education Group  Topic: PAWS/Holiday Plans: the second half of group was spent in a psycho-education session. Group members were provided with handout on PAWS and members took turns reading the symptoms of PAWS - Post Acute Withdrawal Symptoms. Members shared how they had experienced some of these symptoms in their daily lives and the frustrations and fears that come with them. Another handout was provided mid-way through and it requested members complete the calendar from tomorrow through next Monday. They were asked to account for the time over Christmas and what they intend to do. Members shared openly about their plans and more than one member admitted it was good to write it down because she saw that she needed to make plans. The members will be questioned about their holiday plans in the next session to insure they have made efforts to support their recovery.   Summary: The patient reported he had had a good weekend. He noted having attended [employer] and church on Sunday. 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 He denied any significant PAWS  symptoms and reported he is sleeping really good. When asked about his upcoming holiday plans, the patient reported his mother is traveling up from the beach and they are going to her sister's home in [place] for Christmas day. They will return the next day. Everyone is aware of his alcoholism and there will be no alcohol present in the home. The patient noted he was not going to go to his father's home where there will be lots of alcohol and drinking. Everyone is very supportive of his recovery. The patient made some good comments and is doing well in his recovery. His sobriety date is 11/28.    Family Program: Family present? No   Name of family member(s):   UDS collected: Yes Results: not returned yet  AA/NA attended?: YesFriday and Saturday  Sponsor?: No, but he is actively looking for one   Jhoanna Heyde, LCAS

## 2013-07-23 ENCOUNTER — Encounter (HOSPITAL_COMMUNITY): Payer: Self-pay

## 2013-07-23 ENCOUNTER — Other Ambulatory Visit (HOSPITAL_COMMUNITY): Payer: No Typology Code available for payment source | Admitting: Psychology

## 2013-07-23 DIAGNOSIS — F1020 Alcohol dependence, uncomplicated: Secondary | ICD-10-CM

## 2013-07-25 ENCOUNTER — Other Ambulatory Visit (HOSPITAL_COMMUNITY): Payer: Self-pay

## 2013-07-28 ENCOUNTER — Encounter (HOSPITAL_COMMUNITY): Payer: Self-pay | Admitting: Psychology

## 2013-07-28 ENCOUNTER — Other Ambulatory Visit (HOSPITAL_COMMUNITY): Payer: No Typology Code available for payment source | Admitting: Psychology

## 2013-07-28 DIAGNOSIS — F1020 Alcohol dependence, uncomplicated: Secondary | ICD-10-CM

## 2013-07-28 NOTE — Progress Notes (Signed)
    Daily Group Progress Note  Program: CD-IOP   Group Time: 9-10:30 am  Participation Level: Active  Behavioral Response: Appropriate and Sharing  Type of Therapy: Process Group  Topic: Group Process/Psycho-Ed: the first part of group was spent in process and followed by a psycho-ed piece. Members shared about current issues and concerns. The upcoming holiday and any emotions or especially intense feelings resulting were invited. Later in the session, a brief presentation on the Bio-Psycho-Social- Spiritual elements of addiction were identified and discussed. There was good disclosure among members and a good discussion ensued. During this session, the medical director met with the a new group member.  Group Time: 10:45-12 pm  Participation Level: Active  Behavioral Response: Sharing  Type of Therapy: Psycho-education Group  Topic: Psycho-Ed: The Acronym: HOLIDAYS: A handout was provided by HB and members were invited to identify words that are associated with each of the letters in "Holiday". The discussion invited members to share what those words mean to them and how their associations and experiences are challenged or generated through them. The session was powerful and included things not previously shared. During this session, the medical director continued to meet the other new group members and two current members asking to discuss medications. Members had brought food and throughout the session members broke bread and the session proved effective for all.   Summary: The patient reported he had attended 2 meetings since the last group session. There is a good AA meeting only a block from his apartment and he found some very powerful and motivating men attending the meeting. Brett Canales identified the words: "obstacles to success" as what he could most easily relate to. He admitted that he has very often obstructed his own success; almost as if he was self-sabotaging. When asked about  his plans for the upcoming holiday weekend, the patient intends to travel with his mother to [place] later today. They will return the day after Christmas and he will attend church and meetings for the remainder of the weekend. The patient continues to make good progress in his recovery and his sobriety date is 11/28.    Family Program: Family present? No   Name of family member(s):   UDS collected: No Results:   AA/NA attended?: [place] and Tuesday  Sponsor?: No, but he is actively seeking one   Harleen Fineberg, LCAS

## 2013-07-29 ENCOUNTER — Other Ambulatory Visit (HOSPITAL_COMMUNITY): Payer: Self-pay

## 2013-07-30 ENCOUNTER — Other Ambulatory Visit (HOSPITAL_COMMUNITY): Payer: Self-pay

## 2013-07-30 ENCOUNTER — Encounter (HOSPITAL_COMMUNITY): Payer: Self-pay | Admitting: Psychology

## 2013-07-30 NOTE — Progress Notes (Signed)
    Daily Group Progress Note  Program: CD-IOP   Group Time: 1-2:30 pm  Participation Level: Active  Behavioral Response: Appropriate and Sharing  Type of Therapy: Process Group  Topic: Group Process; the first part off group was spent in process. Members shared about the past holiday weekend and any issues or concerns they may have had in early recovery. One member admitted he had taken a percocet on Friday after spending the previous few days helping his daughter move into her new home. He pointed out that some people with chronic pain didn't consider taking a prescription medication properly a problem. He was very resistant and made many excuses. The group said little to challenge this member. Other than this member, everyone else reported their same sobriety date. Three members were absent from the group.   Group Time: 2:45-4 pm  Participation Level: Minimal  Behavioral Response: Sharing  Type of Therapy: Psycho-education Group  Topic: The Neurobiology of Addiction: the second half of group was spent in a psycho-ed session about brain chemistry and addiction. Members watched a brief film by a Wellsite geologist of the [place]. In detailed, yet simple terms, he described what happens in the addict's brain as a result of drug use. The session proved very informative and members agreed that it made sense and helped them better understand their own struggles and journey into addiction. Today, drug tests were collected for all group members present.  Summary: The patient reported he had enjoyed the holiday. He had traveled with his mother and step-father to [place] to spend Christmas day with his aunt and numerous cousins and their children. The home was alcohol-free and everyone was very supportive of the patient. The patient reported he had attended 3 AA meetings and went to church on Sunday. He pulled his 30 day chip out of his pocket and the group applauded this accomplishment.  Despite his first treatment experience, the patient shared little in the psych-ed piece. He is doing well in his recovery, but I will challenge him to begin to take a greater leadership role in the group. His sobriety date remains 11/28.    Family Program: Family present? No   Name of family member(s):   UDS collected: Yes Results: not back from lab  AA/NA attended?: YesFriday, Saturday and Sunday  Sponsor?: No, but he is preparing to ask someone   Jodean Valade, LCAS

## 2013-08-01 ENCOUNTER — Other Ambulatory Visit (HOSPITAL_COMMUNITY): Payer: No Typology Code available for payment source | Attending: Psychiatry | Admitting: Psychology

## 2013-08-01 ENCOUNTER — Encounter (HOSPITAL_COMMUNITY): Payer: Self-pay | Admitting: Psychology

## 2013-08-01 DIAGNOSIS — F1020 Alcohol dependence, uncomplicated: Secondary | ICD-10-CM | POA: Insufficient documentation

## 2013-08-01 NOTE — Progress Notes (Signed)
    Daily Group Progress Note  Program: CD-IOP   Group Time: 1-2:30 pm  Participation Level: Active  Behavioral Response: Appropriate and Sharing  Type of Therapy: Psycho-education Group  Topic: Psycho-Ed: "Relapse Prevention Model". the first half of group was spent in a psycho-ed session. After completion of the check-in, members were provided a handout and the model of relapse prevention was provided. Members provided feedback as the handout was discussed. During this time, the medical director met with two members for discharge and another new group member. The session invited good discussion and feedback.   Group Time: 2:45- 4pm  Participation Level: Active  Behavioral Response: Sharing  Type of Therapy: Process Group  Topic: Psycho-Ed/Guided Relaxation/Graduation. The second half of group was spent in process. Members shared about current issues and concerns. During this session, a passive progressive relaxation exercise was read by this Probation officer while group members participated in the exercise. All members agreed that they had experienced a significant degree of relaxation and serenity. As the session came to a close, a graduation ceremony was held for a member successfully completing the program. There were kind words shared among the group for this admired member. She accepted their words with grace and appreciation. It was a powerful ending to this   Summary: The patient reported he had not gone anywhere after he got home from group on Wednesday night. He had been asleep long before New Year's.  he did express regret for not having gone so he could have seen his fellow group members dancing. The patient reported he was going to an [place] and then having dinner with a tentative sponsor. He reported he is doing well and feels good about his recovery. The patient reported he felt very relaxed during the guided relaxation session. He continues to make good progress and his  sobriety date remains 11/28.  Family Program: Family present? No   Name of family member(s):   UDS collected: No Results:   AA/NA attended?: YesWednesday and Thursday  Sponsor?: No, but he is having dinner with someone this evening and hopes he will agreed to be temporary sponsor   Tametria Aho, LCAS

## 2013-08-04 ENCOUNTER — Other Ambulatory Visit (HOSPITAL_COMMUNITY): Payer: No Typology Code available for payment source | Admitting: Psychology

## 2013-08-04 DIAGNOSIS — F1020 Alcohol dependence, uncomplicated: Secondary | ICD-10-CM

## 2013-08-05 ENCOUNTER — Encounter (HOSPITAL_COMMUNITY): Payer: Self-pay | Admitting: Psychology

## 2013-08-05 ENCOUNTER — Other Ambulatory Visit (HOSPITAL_COMMUNITY): Payer: Self-pay

## 2013-08-05 NOTE — Progress Notes (Signed)
    Daily Group Progress Note  Program: CD-IOP   Group Time: 1-2:30 pm  Participation Level: Active  Behavioral Response: Appropriate and Sharing  Type of Therapy: Process Group  Topic: Group Process: The first part of group was spent in process. Members shared about the past weekend and the things they had done to support their recovery. Almost everyone had attended a meeting and three members reported having secured a new sponsor. There was good disclosure and feedback among group members. During this group session, a drug test was collected from all of the group members.   Group Time: 2:45- 4pm  Participation Level: Active  Behavioral Response: Appropriate and Sharing  Type of Therapy: Psycho-education Group  Topic: Psycho-Ed/Graduation: the second half of group was spent in a psycho-ed session on one's "Person to Person Map". The handout consisted of a circle and members were asked to place themselves in the middle of it and then identify all of the people in their lives. Their closeness and size represented the importance each person holds for the patient. Each member then drew their circle on the board. As the session neared an end, a graduation ceremony was held to honor a member who was leaving the program. The medallion was passed around with members sharing their thoughts and hopes for the successfully graduating member. She is a highly regarded member who has made great progress and she urged the remaining members to be open and honest if they are to benefit from this program.   Summary: The patient reported he had secured a new sponsor. The sponsor attends the same home group and he does and he will go out for dinner once a week with his new sponsor and others after their meeting. The patient admitted he is very pleased about this and looks forward to working with him closely in the Steps and addressing his alcoholism. The patient admitted that he is to phone his sponsor daily  - no text messages. The patient is attending at least 6 AA meetings per week and also attends church on Sunday. He noted his father, who is a very heavy drinker, had come by on Saturday morning and they had talked and watched football for about two hours. It had been nice to be with him without alcohol. The patient drew his map and he has a lot of support for his recovery. There is the alcoholic element in his family, but he spends little time with them. The patient displays good insight and continues to do everything we have asked of him. The patient responded well to this intervention and his sobriety date remains 11/26.     Family Program: Family present? No   Name of family member(s):   UDS collected: Yes Results: not back yet from the lab  AA/NA attended?: [place], Saturday and Sunday  Sponsor?: Yes, he just got a sponsor this past week   Kenneth Good, LCAS

## 2013-08-06 ENCOUNTER — Other Ambulatory Visit (HOSPITAL_COMMUNITY): Payer: No Typology Code available for payment source | Admitting: Psychology

## 2013-08-06 DIAGNOSIS — F1020 Alcohol dependence, uncomplicated: Secondary | ICD-10-CM

## 2013-08-07 ENCOUNTER — Other Ambulatory Visit (HOSPITAL_COMMUNITY): Payer: Self-pay

## 2013-08-08 ENCOUNTER — Other Ambulatory Visit (HOSPITAL_COMMUNITY): Payer: No Typology Code available for payment source | Admitting: Psychology

## 2013-08-08 DIAGNOSIS — F1020 Alcohol dependence, uncomplicated: Secondary | ICD-10-CM

## 2013-08-11 ENCOUNTER — Other Ambulatory Visit (HOSPITAL_COMMUNITY): Payer: No Typology Code available for payment source | Admitting: Psychology

## 2013-08-11 ENCOUNTER — Encounter (HOSPITAL_COMMUNITY): Payer: Self-pay | Admitting: Psychology

## 2013-08-11 DIAGNOSIS — F1020 Alcohol dependence, uncomplicated: Secondary | ICD-10-CM

## 2013-08-11 NOTE — Progress Notes (Signed)
    Daily Group Progress Note  Program: CD-IOP   Group Time: 1-2:30 pm  Participation Level: Active  Behavioral Response: Appropriate and Sharing  Type of Therapy: Process Group  Topic: Group Process: the first half of group was spent in process. Members shared concerns, issues or successes that they had experienced since the last session. Two members were honored for significant achievements. One young woman had attained 9 months of sobriety and for the other member, she was celebrating her 61th birthday today. During this session, new members were meeting with the Tree surgeonprogram director.  Group Time: 2:45- 4pm  Participation Level: Active  Behavioral Response: Appropriate and Sharing  Type of Therapy: Psycho-education Group  Topic: Finishing the Movie: the second half of group was spent watching the last part of the movie that had been started during the last session. Upon its ending, the group processed the film and the tragedy of addiction for all of the family members. They were invited to share anything that may have come up for them in looking back at their own family systems.  Summary: The patient reported he had had a good meeting with his attorney and provided her all of the documentation he had received from this program. The patient reported he has gotten his sponsor and will be meeting weekly with him to discuss recovery and doing step work. The patient agreed that the movie was tragic, but he provided good feedback when asked what could have been done differently years previous. the patient provided good support for his fellow group members and is making good progress in his recovery. His sobriety date remains 11/28.   Family Program: Family present? No   Name of family member(s):   UDS collected: No Results:  AA/NA attended?: YesWednesday and Thursday  Sponsor?: Yes   Kaci Freel, LCAS

## 2013-08-12 ENCOUNTER — Other Ambulatory Visit (HOSPITAL_COMMUNITY): Payer: Self-pay

## 2013-08-12 ENCOUNTER — Encounter (HOSPITAL_COMMUNITY): Payer: Self-pay | Admitting: Psychology

## 2013-08-12 NOTE — Progress Notes (Signed)
    Daily Group Progress Note  Program: CD-IOP   Group Time: 1:00-2:30pm  Participation Level: Active  Behavioral Response: Appropriate and Sharing  Type of Therapy: Process Group  Topic: the first half of group was spent in process. Upon the completion of check-in, members discussed issues and concerns in early recovery. They recounted what they had done to support their recovery since the last group session. The results of drug tests were returned with no surprises. A new member was present and she introduced herself and recounted her relapses after 23 years of sobriety. The session was intense and members very attentive to this new group member's disclosures.     Group Time: 2:45-4:00pm  Participation Level: Active  Behavioral Response: Appropriate  Type of Therapy: Psycho-education Group  Topic: the second half of group was spent watching a portion of a DVD followed up by a group discussion. Members watched a movie about an alcoholic family and the dysfunction that envelops all of the members, including ones that don't even use. The film was tragic for the pain and damage done to the family's emotional sensibilities. The ensuing discussion was poignant with everyone sharing what they had felt about watching this disaster unfold.    Summary: The patient reported he had attended 2 AA meetings and met with his sponsor since the last session. He is pleased that his sponsor will be working closely with him and he will meet a lot of other men in recovery to further his sobriety. He made some good comments and provided excellent feedback to his fellow group members. The patient had seen this film before so he was ordered not to disclose the ending. He agreed that it was a very painful film and the family had been so damaged by the alcoholic father. The patient continues to do extremely well and displays incredible insight despite 30 years of drinking. His sobriety date remains 11/28.     Family Program: Family present? No   Name of family member(s): n/a  UDS collected: No Results: n/a  AA/NA attended?: YesMonday and Tuesday  Sponsor?: Yes   [facility]        

## 2013-08-12 NOTE — Progress Notes (Signed)
    Daily Group Progress Note  Program: CD-IOP   Group Time: 1-2:30 pm  Participation Level: Active  Behavioral Response: Appropriate and Sharing  Type of Therapy: Process Group  Topic: Group Process: the first part of group was spent in process. Members shared about current issues and concerns in early recovery. They were asked to disclose what they had done over the weekend to support their sobriety. There was good disclosure among the group and discussion and feedback about the issues. During the group session today, drug tests were collected from each group member.   Group Time: 2:45- 4pm  Participation Level: Active  Behavioral Response: Sharing  Type of Therapy: Psycho-education Group  Topic: Serenity Prayer/Graduation: the second part of group was spent in a psycho-ed session on the serenity prayer. A handout was provided asking members to identify 5 things they could not change and 5 things that they could change. The members then shared some of their answers with the group. The importance of focusing on the things that one can change was reiterated. At the end of the session, the graduation ceremony was held, brownies and passing around the graduation medallion occurred. Kind words and well wishes were given to the member leaving the program and she encouraged her fellow group members to work hard and be honest and they can change their lives. The patient leaves having attained 9+ months of total sobriety.   Summary: The patient reported he had good news. 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 He was able to secure a scholarship to pay for the entire program and costs, including living expenses. The group applauded this wonderful news. One member reminded him that it would never have been possible had he still been drinking. This member nodded and agreed with him. The patient explained they 'have to' play  golf a number of times during the week. He will leave late May. He was thrilled to share this news and couldn't stop smiling. He reported he had attended 3 meetings and gone to church on Sunday. He was feeling very good and had spoken with his sponsor as instructed everyday since securing him. The patient reported he couldn't change the weather, but that he could move to [place]! The group laughed. The patient shared some very kind and heartfelt words with the member graduating today. He talked about how she had inspired him and how grateful he was to have met her. He continues to make excellent progress in his recovery and his sobriety date remains 11/28.   Family Program: Family present? No   Name of family member(s):   UDS collected: Yes Results: not back from lab yet  AA/NA attended?: YesSaturday and Sunday  Sponsor?: Yes   Trusten Hume, LCAS

## 2013-08-13 ENCOUNTER — Encounter (HOSPITAL_COMMUNITY): Payer: Self-pay | Admitting: Psychology

## 2013-08-13 ENCOUNTER — Other Ambulatory Visit (HOSPITAL_COMMUNITY): Payer: No Typology Code available for payment source | Admitting: Psychology

## 2013-08-13 DIAGNOSIS — F1020 Alcohol dependence, uncomplicated: Secondary | ICD-10-CM

## 2013-08-14 ENCOUNTER — Other Ambulatory Visit (HOSPITAL_COMMUNITY): Payer: Self-pay

## 2013-08-14 NOTE — Progress Notes (Unsigned)
Patient ID: Kenneth Good, male   DOB: 1966/01/14, 48 y.o.   MRN: [id number] CD-IOP: Individual Therapy Session. I met with the patient at the conclusion of the group session. He reported things were going well. He is feeling very good about himself, is becoming more awake and conscious each day, and feels very confident with his place and plans for the future. We discussed his treatment goals. He is making good progress in his recovery, remains sober with a date of 11/28, and has secured a sponsor and is working very closely with him. He has applied and was accepted to a program for [facility] in sports management and will move to [facility] in mid-April. Between now and then, the patient will remain engaged in the program, strengthen his recovery on a daily basis, and address his personal issues and problems that have contributed to his alcoholism. The patient reported he has an appointment with his surgeon next week to discuss the upcoming surgery. This will be his fourth surgery to address the damage done when he fell face first on concrete stairs in the spring. He will meet with the medical director on Friday to discuss pain medications and the best way to avoid any issues with narcotics. Although his primary drug of addiction is alcoholism, the patient recognizes that other drugs, in other forms, can threaten his sobriety. The patient has good support from family and is making new friendships every day in the 'rooms'. The patient continues to make excellent progress and is making every effort to change his life and attain long term sobriety. We will continue to follow in the days and weeks ahead.

## 2013-08-15 ENCOUNTER — Other Ambulatory Visit (HOSPITAL_COMMUNITY): Payer: No Typology Code available for payment source | Admitting: Psychology

## 2013-08-15 ENCOUNTER — Encounter (HOSPITAL_COMMUNITY): Payer: Self-pay | Admitting: Psychology

## 2013-08-15 DIAGNOSIS — F10229 Alcohol dependence with intoxication, unspecified: Secondary | ICD-10-CM

## 2013-08-15 NOTE — Progress Notes (Signed)
Patient ID: Kenneth PigeonSteven Edsall, male   DOB: [birth year], 48 y.o.   MRN: [id number] S Pt seen in consultation for possible upcoming Oral Surgery to repair residual dental damage from multiple (4) fractures of jaw (mandible bilateral and lt maxilla) July 7,2014. Initial surgery done then  And now has broken teeth that need repair along with further stabilization of the mandible.He is to have [facility] this coming Tuesday to determine if he will have surgery now or delay another 30-60 days.The concern is over prescription pain meds now that he is in recovery from his alcoholism.His only previous experience with opiate pain meds was in July and he kept drinking alcohol at that time.He reports he actually took less than prescribed.It was during this period that he got his DUI and got "real sick "Thanksgiving eve/"violently ill" -felt like he was going to die and stopped ETOH and Drugs on his own. This was after [facility] and AA meetings that led him to abstinence until August when he acidentally tripped cleaning his apt so his girlfriend could move in and fractured his jaw. He sought CD IOP 1 week later and now has 50 days clean and sober. 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      Request shot of Toradol 60 mg post op and rx for 5 day supply .Start Motrin 600 mg after Toradol rx as needed.      FU 1 week

## 2013-08-15 NOTE — Progress Notes (Unsigned)
    Daily Group Progress Note  Program: CD-IOP   Group Time: 1:00-2:30pm  Participation Level: Active  Behavioral Response: Appropriate  Type of Therapy: Process Group  Topic: The group checked in by sharing their names and sobriety dates and were given the opportunity to briefly share how they were doing that day. At 1:30 a visitor from the [employer], [place], arrived and presented for about an hour.  She shared a handout of quotes about transcendence and had the group members share their thoughts about transcendence.  She also discussed suffering and helped them understand how transcendent experiences can help them overcome suffering.  She also explained that transcendent experiences do not have to come from a particular god or religious tradition, but can come from things such as nature and music.  Several group members shared about their experiences with transcendence, identifying a wide variety of sources of transcendence, including religion, nature, and AA/NA meetings.     Group Time: 2:45-4:00pm  Participation Level: Active  Behavioral Response: Appropriate  Type of Therapy: Psycho-education Group  Topic:   Counselor led the group in a continued discussion of the topic from process group, asking that all group members share something they gained or learned from Alexis's presentation.  Several group members expressed that they had enjoyed the discussion and felt they had gained a lot from it.  Some group members shared that the conversation served as a reminder about how important spiritual experiences are, and said that they felt more committed to seeking them out.  The discussion then moved to other topics, and several group members shared about personal and family issues that were affecting them.  Counselor closed the group by having a group member read the thought for the day.   Summary: Patient reported a sobriety date of 11/28.  He shared that he had attended  his home group  meeting the day before, where he saw a former group member.  He spent time reconnecting with her at the meeting, and reported to the group that she was doing very well.  He shared a meaningful quote with the group, which had come from his management program acceptance letter.  He was somewhat quiet during the chaplain's presentation, but during the second part of the group session he connected with several other group members.    Family Program: Family present? No   Name of family member(s): n/a  UDS collected: Yes Results: not yet available  AA/NA attended?: YesMonday  Sponsor?: Yes   [facility]

## 2013-08-18 ENCOUNTER — Other Ambulatory Visit (HOSPITAL_COMMUNITY): Payer: No Typology Code available for payment source | Admitting: Psychology

## 2013-08-18 DIAGNOSIS — F10230 Alcohol dependence with withdrawal, uncomplicated: Secondary | ICD-10-CM

## 2013-08-19 ENCOUNTER — Encounter (HOSPITAL_COMMUNITY): Payer: Self-pay | Admitting: Psychology

## 2013-08-19 ENCOUNTER — Other Ambulatory Visit (HOSPITAL_COMMUNITY): Payer: No Typology Code available for payment source

## 2013-08-19 ENCOUNTER — Other Ambulatory Visit (HOSPITAL_COMMUNITY): Payer: Self-pay

## 2013-08-19 NOTE — Progress Notes (Signed)
    Daily Group Progress Note  Program: CD-IOP   Group Time: 1:00-2:30pm  Participation Level: Active  Behavioral Response: Appropriate  Type of Therapy: Process Group  Topic: first part of group was spent in process. Members shared about the past weekend and any issues or concerns in early recovery. Members talked about relationship issues, family problems, feelings of guilt and cravings. A new member was present and the group welcomed her. A PA student from [employer] was also in group today and she was engaged in the session and provided good information when asked. Drug tests were collected from all group members today.      Group Time: 2:45-4:00pm  Participation Level: Active  Behavioral Response: Appropriate  Type of Therapy: Psycho-education Group  Topic: Relapse: The 4-Step Process; the second half of group was spent in a psycho-ed session. One member shared about shopping at a local grocery store and then being very tempted to [facility] directly [facility]. She described thinking how it would be nice to have a drink and quickly thereafter, she experienced a powerful craving and stated, "I could taste the vodka". An explanation of the relapse process was reviewed and members provided good comments and feedback as the process was discussed. The session was very informative and everyone agreed they could easily relate to the process.    Summary: The patient reported he is doing well. He had attended 2 AA meetings, gone to dinner with his sponsor and some other gentlemen in recovery, and had talked about the steps. He noted he had attended church, but spent the remainder of the day on the couch watching football. The patient reported he had twisted his back somehow climbing up the steps with groceries and cat litter. He was sore, but it was getting better. He continues to make good progress and responded well to this intervention. His sobriety date remains 11/28.     Family Program: Family present? No   Name of family member(s): n/a  UDS collected: Yes Results: not yet available  AA/NA attended?: Yes Friday and Saturday  Sponsor?: Yes   Jacqulyn Barresi, LCAS

## 2013-08-19 NOTE — Progress Notes (Signed)
° ° °  Daily Group Progress Note  Program: CD-IOP   Group Time: 1:00-2:30pm  Participation Level: Active  Behavioral Response: Appropriate  Type of Therapy: Process Group  Topic: Stress: What Stresses You Out and How does it Effect You? the first half group was spent in process. Members shared about current issues and concerns in recovery. Upon competing the check-on, a discussion followed on the topic of Stress. Members identified what stressors they experience in their lives and how they are effected by these stressors. There were many different examples provided and group members identified many of the same stressors. During the session today, the program director met with two new group members and also spoke with two current members about medication concerns.     Group Time: 2:45-4:00pm  Participation Level: Active  Behavioral Response: Appropriate  Type of Therapy: Psycho-education Group  Topic: Heart Math: the second half of group was spent in a psycho-ed presentation on Heart Math. Members were provided with a handout and explanation on the basic concepts behind this relaxation practice. Afterwards, members volunteered to hook up and use the computer program to practice their breathing in sequence with the program . The session was very transformative for a  number of group members and the session ended with members excited about this new tool.    Summary: The patient reported he is doing well. He attended 2 meetings and will meet with his sponsor this weekend. He is going to attend the [facility] on Sunday evening at [facility] and see what that is about. He reported he experiences stress due to financial problems and his lack of transportation, but he is feeling much better about himself now that he is sober and getting things accomplished. The patient reminded the group that he has 50 days sober today. The patient was engaged in the presentation and agreed that it was  exciting to think that breathing could prove so powerful. He also met with the medical director to discuss his upcoming surgery and his concerns about the medications. He continues to make excellent progress and his sobriety date remains 11/28.   Family Program: Family present? No   Name of family member(s): n/a  UDS collected: No Results: n/a  AA/NA attended?: YesWednesday and Thursday  Sponsor?: Yes   Keshonda Monsour, LCAS

## 2013-08-20 ENCOUNTER — Other Ambulatory Visit (HOSPITAL_COMMUNITY): Payer: No Typology Code available for payment source | Admitting: Psychology

## 2013-08-21 ENCOUNTER — Other Ambulatory Visit (HOSPITAL_COMMUNITY): Payer: Self-pay

## 2013-08-22 ENCOUNTER — Encounter (HOSPITAL_COMMUNITY): Payer: Self-pay | Admitting: Psychology

## 2013-08-22 ENCOUNTER — Other Ambulatory Visit (HOSPITAL_COMMUNITY): Payer: No Typology Code available for payment source | Admitting: Psychology

## 2013-08-22 DIAGNOSIS — F1020 Alcohol dependence, uncomplicated: Secondary | ICD-10-CM

## 2013-08-22 NOTE — Progress Notes (Signed)
    Daily Group Progress Note  Program: CD-IOP   Group Time: 1-2:30 pm  Participation Level: Active  Behavioral Response: Appropriate and Sharing  Type of Therapy: Process Group  Topic: Patients checked in by sharing their names and sobriety dates and reported the recovery activities they had done since the last group session.  They also shared concerns and difficulties related to being in early recovery and offered support for one another.  The counselor reviewed the 4-step relapse process and group members discussed actions they can take to distract themselves from thoughts about using.  Group members also offered feedback for one patient who reported that she was "doing nothing" for her recovery.  Group Time: 2:45- 4pm  Participation Level: Active  Behavioral Response: Appropriate and Sharing  Type of Therapy: Psycho-education Group  Topic: Counseling intern led a presentation on wellness.  Group members discussed the definition of wellness including key points such as wellness as a lifestyle and wellness choices.  They then rated their own current level of wellness, and several group members explained the factors that contributed to their ratings.  Counseling intern provided a handout on Daily Maintenance Plans and led group members through the handout.  They discussed the differences in themselves when they are well v. unwell, activities they need to do daily to stay well, and activities they can do occasionally when they need to restore their wellness.  Summary: :  Patient reported a sobriety date of 11/28.  He shared that he had attended a meeting yesterday which was very uplifting.  He also reported that he had [facility] and was encouraged to see him continuing a maintenance plan for his recovery.  During a discussion on spiritual well-being, he shared that he had attended church while he was drinking. I challenged him on this and questioned  whether one can be truly spiritual, but actively addicted as well? The patient admitted he was going to church, "to please other people".  He shared that one step he could take to improve his wellness was to eat a healthy dinner and get adequate sleep. He continues to make excellent progress and responded well to this intervention.    Family Program: Family present? No   Name of family member(s):   UDS collected: No Results:   AA/NA attended?: YesMonday and Tuesday  Sponsor?: Yes   Sharilynn Cassity, LCAS

## 2013-08-22 NOTE — Progress Notes (Signed)
Patient ID: Kenneth Good, male   DOB: Jun 05, 1966, 48 y.o.   MRN: [id number]  S-FU on planned Dental surgery to repair 4 multiple jaw fractures after initial ORIF in July of 2014,Met with Oral Surgeon Wednesday.Panorex revealed 1 small area on nonhealing but not feltl to be problem. Teeth cleaned and he was noted to have some trismus.Due to extent of repairs he will have 3 sessions 1/28;2/3 and 2/10.The surgeon feels he will not require opiates unless he finds pain not bearable with Toradol and Motrin Rxs.He reports his "one day at a time " program is intact with meeting and sponsorship.He ran into his sponsor in the [facility]  OP waiting area Weds acidentally and discovered he had [facility] in common with his sponsor as well.  O-Behavior-psychologically comprehensible     Orientation-Full     Affect-Full Range     Perception- Intact for his alcoholism and his upcoming Dental surgeries     Speech and thought-normal and comprehensible     Judgement-Good  A Alcohol dependence in early remission and responding to treatments    S/P compound jaw fractures with ORIF and further surgery planned    Use of Opiates plan if needed understood and agreed to by patient  P-As outlined last week-Toradol IM post op + po x 5days then Motrin 600 mg 3-4x daily.Pt will receive rx for prn opiates which fiancee will hold and manage so he can take a sprescribed.    If he needs opiates he is to take on schedule not wait for pain to become unbearable.Discard any unused opiates    Fu next week

## 2013-08-23 ENCOUNTER — Encounter (HOSPITAL_COMMUNITY): Payer: Self-pay | Admitting: Psychology

## 2013-08-23 NOTE — Progress Notes (Signed)
    Daily Group Progress Note  Program: CD-IOP   Group Time: 1-2:30 pm  Participation Level: Active  Behavioral Response: Appropriate and Sharing  Type of Therapy: Process Group  Topic:Group Process/Heart Math: the first part of group was spent checking-in, a 20 minute Heart Math review, and, finally, process. All members present reported ongoing abstinence since the last group. During this time the program director met with the new group member and two others who had questions about current medications.  During the Heart Math demonstrations, two members 'hooked up' and practiced their breathing with one of the programs available on the computer system. During the check-in, there were some good observations and questions, members provided good feedback and the session went well.    Group Time: 2:45- 4pm  Participation Level: Active  Behavioral Response: Sharing  Type of Therapy: Psycho-education Group  Topic: Psycho-Ed: Substance Dependence Criteria. The second half of group was spent in a psycho ed presentation on the criteria that are necessary for a substance dependency diagnosis. A handout was provided with the criteria as identified in the DSM V and members took time reading each criteria with a subsequent discussion with examples following each of the criteria. Every member except one admitted that they met all of the 11 criteria. The other member admitted she needed to think about it for a while. The session proved very effective and the feedback and honesty displayed among members was very open.   Summary: The patient reported he had attended meetings and spoken with his sponsor since the last group session. He reminded the group he had seen his sponsor in the waiting room here at [facility]. He reported the surgeons have completed the preliminaries and are going to complete the first of three surgeries starting next week. He has met with the program director here and discussed the need  for pain pills at length. The patient has done so well and does not want to lose any ground or risk relapse using narcotic pain meds. He made some comments to the member who shared her secret, but the group reminded him that he was advocating she do something that wasn't totally honest and transparent. The patient agreed that his previous recommendation probably wasn't the way to go. During part two, the patient shared his recent realizations that his ex-wife had accused him of years ago. Now that he is getting his memory back, he realizes that what she was saying was true. He felt bad, but admitted his drinking had priority over everything and everyone else, including his wife and step-children. The patient reported that when he has more sobriety he will one day write his ex-wife a letter and apologize for the way he treated her. He expressed his belief that she would be pleased to hear that he is sober. The patient continues to make good progress and his sobriety date remains 11/28.   Family Program: Family present? No   Name of family member(s):   UDS collected: No Results:   AA/NA attended?: YesWednesday and Thursday  Sponsor?: Yes   Braydon Kullman, LCAS

## 2013-08-25 ENCOUNTER — Other Ambulatory Visit (HOSPITAL_COMMUNITY): Payer: Self-pay

## 2013-08-25 ENCOUNTER — Other Ambulatory Visit (HOSPITAL_COMMUNITY): Payer: No Typology Code available for payment source | Admitting: Psychology

## 2013-08-25 DIAGNOSIS — F1020 Alcohol dependence, uncomplicated: Secondary | ICD-10-CM

## 2013-08-25 NOTE — Progress Notes (Unsigned)
Patient ID: Kenneth Good, male   DOB: Oct 29, 1965, 48 y.o.   MRN: [id number] CD-IOP: Individual Therapy Session. I met with the patient as scheduled. He reported he was doing really well. The patient has remained sober, has a sponsor, and is attending meetings at least 6 days per week. He is feeling good and is eating a lot and has gained back almost 20 lbs. He had met with the medical director prior to our session. The patient is scheduled for a surgical procedure on Wednesday and he wants to insure that any pain medications are handled appropriately and not any threat to his sobriety. I complimented him on the progress he has made and his engagement in group sessions. He reported that he really enjoys the group and likes seeing some of the members in meetings. He also sees former members who have gone on to graduate. He has been provided all of the documentation at this time to address his DWI. The attorney had the case continued. While that it going on, he is not driving and travels everywhere by bus. He admitted in group that it was actually very inexpensive and he didn't mind it at all. The patient has done everything we have asked of him and after almost 30 years of heavy drinking, he enjoys a sobriety date of 11/28. The patient is a scholarship recipient and admitted he would like to stay as long as possible in the program. He continues to make excellent progress and we will follow closely in the days and weeks ahead.

## 2013-08-27 ENCOUNTER — Other Ambulatory Visit (HOSPITAL_COMMUNITY): Payer: No Typology Code available for payment source | Admitting: Psychology

## 2013-08-27 ENCOUNTER — Other Ambulatory Visit (HOSPITAL_COMMUNITY): Payer: Self-pay

## 2013-08-27 ENCOUNTER — Encounter (HOSPITAL_COMMUNITY): Payer: Self-pay | Admitting: Psychology

## 2013-08-27 DIAGNOSIS — F1020 Alcohol dependence, uncomplicated: Secondary | ICD-10-CM

## 2013-08-27 NOTE — Progress Notes (Signed)
    Daily Group Progress Note  Program: CD-IOP   Group Time: 1-2:30 pm  Participation Level: Active  Behavioral Response: Appropriate and Sharing  Type of Therapy: Process Group  Topic: Group Process. The first half of group was spent in process. Members shared bout the past weekend and any issues or concerns that had come up for them. The daily reflection dealt with not getting pulled into the fray with toxic people. Most of the group members could easily relate to this. There was good discussion and disclosure among members. During this session, drug tests were collected from all of the members.  Group Time: 2:45- 4pm  Participation Level: Active  Behavioral Response: Appropriate and Sharing  Type of Therapy: Psycho-education Group  Topic: Family Sculpture; the second half of group was spent in an activity that addressed one's early life and the family dynamics they were raised in. Members were asked to 'sculpt' their families using other group members to represent each of their family members, including themselves. The group provided feedback and asked questions as each sculpture was presented. The activity evoked powerful memories and emotions and the two members who sculpted their families shared feelings and experiences that had not previously been disclosed. The group bonded and became closer as a result of this activity. It was a therapeutic and healing opportunity for the members who sculpted their families.   Summary: The patient reported he had attended a meeting on Friday night, had lunch with his father on Saturday and attended another meeting that evening. He admitted he had slept him on Sunday and then got in cleaning mood and cleaned his entire apartment. He admitted he was tired at the end of the day, but it had felt good. He made some good comments during the sculptures, but interestingly enough, despite having men in each sculpture, he was not chosen to represent them  and just watched them from the sidelines. The patient continues to make good progress in his sobriety and responded well to this intervention. He is scheduled to have a surgical procedure on Wednesday morning, but seemed confident he would be in group. I assured him that if he was in too much discomfort, he would be excused from the session. The patient's sobriety date remains 11/28.    Family Program: Family present? No   Name of family member(s):   UDS collected: Yes Results: negative  AA/NA attended?: YesFriday and Saturday  Sponsor?: Yes   Kassadee Carawan, LCAS

## 2013-08-28 ENCOUNTER — Encounter (HOSPITAL_COMMUNITY): Payer: Self-pay | Admitting: Psychology

## 2013-08-28 NOTE — Progress Notes (Signed)
    Daily Group Progress Note  Program: CD-IOP   Group Time: 1-2:30 pm  Participation Level: Minimal  Behavioral Response: Appropriate  Type of Therapy: Psycho-education Group  Topic: Psycho-Ed; The first half of group was spent with a visiting speaker. A gentleman who has attained 10 years of sobriety and active in [employer] came to the group to tell his story. Group members were respectful and attentive as this man talked about his life, addiction, and the struggles he has faced in recovery. He also talked about how [employer] has changed his life in helping him deal with owning his stuff and letting others assume responsibility for themselves. The session was powerful and compelling.   Group Time: 2:45- 4pm  Participation Level: Minimal  Behavioral Response: Sharing  Type of Therapy: Process Group  Topic: Group Process; The second half of group was spent in process. Members shared about current issues and concerns. A new group member was present and he was asked to introduce himself. Members were attentive as this young man shared his story. It seemed clear that he belonged in this program. The session proved effective and many of the members talked about the powerful speaker and session.   Summary: The patient appeared for group this afternoon despite having had oral surgery early this morning. He admitted he was a little sore, but had taken some ibuprofen and felt okay. He was attentive and spoke admiringly of the guest speaker. The patient made some good comments and continues to make excellent progress in his recovery. The patient's sobriety date is 11/28.    Family Program: Family present? No   Name of family member(s):   UDS collected: No Results:   AA/NA attended?: YesMonday and Tuesday  Sponsor?: Yes   Blayn Whetsell, LCAS

## 2013-08-29 ENCOUNTER — Other Ambulatory Visit (HOSPITAL_BASED_OUTPATIENT_CLINIC_OR_DEPARTMENT_OTHER): Payer: Self-pay | Admitting: Psychology

## 2013-08-29 ENCOUNTER — Other Ambulatory Visit (HOSPITAL_COMMUNITY): Payer: Self-pay

## 2013-08-29 ENCOUNTER — Encounter (HOSPITAL_COMMUNITY): Payer: Self-pay

## 2013-08-29 DIAGNOSIS — S02609A Fracture of mandible, unspecified, initial encounter for closed fracture: Secondary | ICD-10-CM

## 2013-08-29 DIAGNOSIS — F1020 Alcohol dependence, uncomplicated: Secondary | ICD-10-CM

## 2013-08-29 NOTE — Progress Notes (Signed)
Patient ID: Elisabeth PigeonSteven Dubin, male   DOB: [birth year], 48 y.o.   MRN: [id number] S-FU visit for ongoing dental repair of compound jaw fractures July 2014 from fall.Brett CanalesSteve reports he had a cleaning and filling without novocaine.He reports some soreness post procedure that was handled remarkably well with Motrin brand ibuprofen.He had neve r used this particular medication before and was very pleased with its efficacy-in fact he noted it also took care of his back pain he awoke with one day This week he is to  receive peroxide rx prior to 3 root canals. He is not anticipating much pain,  The 3rd procedure will involve stabilization of his mandible and maxilla with plates  will review pain management techniques next week in anticipation of this 3rd procedure which may require narcotic analgesia post op.  O-Alert oriented appropriate comprehensible with good insight and judgment Speech wnl.He is in NAD.Neurologically intact.  A-Alcohol Dependency in early remission    Compound fxs of mandible with fx of maxilla 01/2013 S/P ORIF with ongoing surgical correction and stabilization as well as ancillary Dental care  P- Continue CD IOP recovery program      FU Oral surgery and Dental care as scheduled      Pain management per plan

## 2013-09-01 ENCOUNTER — Other Ambulatory Visit (HOSPITAL_COMMUNITY): Payer: No Typology Code available for payment source | Attending: Psychiatry | Admitting: Psychology

## 2013-09-01 ENCOUNTER — Other Ambulatory Visit (HOSPITAL_COMMUNITY): Payer: Self-pay

## 2013-09-01 ENCOUNTER — Encounter (HOSPITAL_COMMUNITY): Payer: Self-pay | Admitting: Psychology

## 2013-09-01 DIAGNOSIS — S02609A Fracture of mandible, unspecified, initial encounter for closed fracture: Secondary | ICD-10-CM

## 2013-09-01 DIAGNOSIS — F1020 Alcohol dependence, uncomplicated: Secondary | ICD-10-CM | POA: Insufficient documentation

## 2013-09-01 NOTE — Progress Notes (Signed)
    Daily Group Progress Note  Program: CD-IOP   Group Time: 1-2:30 pm  Participation Level: Minimal  Behavioral Response: Appropriate and Sharing  Type of Therapy: Process Group  Topic: Group members checked in by sharing their names and sobriety dates.  Two guests, one former group member and one group member's mother, also introduced themselves.  Counseling intern led the group in 5 minutes of Heart Math to help group members relax and focus.  Group members then shared about how they were doing in their recovery, expressed any concerns, and offered one another feedback.  Counselor also reviewed the 4-step relapse process.  Group Time: 2:45- 4pm  Participation Level: Active  Behavioral Response: Appropriate and Sharing  Type of Therapy: Psycho-education Group  Topic: Two group members completed family sculptures and the group interpreted and discussed them.  Both group members who created their sculptures offered detailed explanations of the memories that they depicted in their sculptures.  Other group members offered support and connected with these two group members by sharing about their own families.  A graduation ceremony was held for the last part of group.  Summary:  Patient reported a sobriety date of 11/28.  He reported that he was feeling better after his first dental procedure on Wednesday.  He shared that he had attended an evening meeting the day before, but found that it disrupted his sleep schedule and that he would stick to daytime meetings in the future.  He participated in both family sculptures during the psychoeducation group session.  Patient continues to do well in his recovery, showing commitment to a full schedule of recovery activities.   Family Program: Family present? No   Name of family member(s):   UDS collected: No Results:   AA/NA attended?: YesWednesday, Thursday and Friday  Sponsor?: Yes   Leannah Guse, LCAS

## 2013-09-02 ENCOUNTER — Encounter (HOSPITAL_COMMUNITY): Payer: Self-pay | Admitting: Psychology

## 2013-09-02 NOTE — Progress Notes (Signed)
    Daily Group Progress Note  Program: CD-IOP   Group Time: 1-2:30 pm  Participation Level: Active  Behavioral Response: Appropriate and Sharing  Type of Therapy: Process Group  Topic: Group Process: the first part of group was spent in process. Members shared about the past weekend and any thoughts, issues, or challenges that may have been difficult for ongoing sobriety were discussed. Also disclosed were meetings attended, conversations with sponsor or others, step work or anything else that supported sobriety. A new group member was present and she was quiet, but attentive. The weekend proved productive for many group members and everyone remained abstinent. Drug tests were collected from at least 5 members.   Group Time: 2:45- 4pm  Participation Level: Minimal  Behavioral Response: Sharing  Type of Therapy: Psycho-education Group  Topic: Dopamine Deficiency and the power of Emotional Memories. A psycho-ed presentation was made in the second half of group. This biologically-based disease of addiction was described as a dopamine deficiency disease occurring in one's brain. The research was discussed and a slide show presented showing dopamine discharges in the brain from sex, food, and drugs. In each instance, the dopamine discharged due to the presence of drugs was much greater. The resulting power of the addiction and drive to use was emphasized. The discomfort that many go through in early recovery was explained. Although it is difficult, members were assured that they would feel better if they could remain abstinent. The new member shared about her drug use and life and received a warm welcome from her new group members.   Summary: The patient reported a good weekend. He had watched the Superbowl with two cats and he admitted it was the first time in many years that he watched it sober. He had attended 3 meetings and met with his sponsor. This patient is a real creature of habit and  has established a very good daily routine which he follows very closely. In the second half of group, the patient admitted he continued to drink despite feeing sick and he realized it was crazy, but he kept doing it. The patient provided good feedback to his fellow group members and continues to make good progress in his recovery. His sobriety date remains 11/28.   Family Program: Family present? No   Name of family member(s):   UDS collected: No Results:  AA/NA attended?: YesMonday, Friday, Saturday and Sunday  Sponsor?: Yes   Heidi Lemay, LCAS

## 2013-09-03 ENCOUNTER — Other Ambulatory Visit (HOSPITAL_COMMUNITY): Payer: Self-pay

## 2013-09-03 ENCOUNTER — Other Ambulatory Visit (HOSPITAL_COMMUNITY): Payer: No Typology Code available for payment source | Admitting: Psychology

## 2013-09-03 DIAGNOSIS — F10230 Alcohol dependence with withdrawal, uncomplicated: Secondary | ICD-10-CM

## 2013-09-05 ENCOUNTER — Encounter (HOSPITAL_COMMUNITY): Payer: Self-pay | Admitting: Psychology

## 2013-09-05 ENCOUNTER — Other Ambulatory Visit (HOSPITAL_COMMUNITY): Payer: Self-pay

## 2013-09-05 ENCOUNTER — Other Ambulatory Visit (HOSPITAL_COMMUNITY): Payer: No Typology Code available for payment source | Admitting: Psychology

## 2013-09-05 DIAGNOSIS — F1020 Alcohol dependence, uncomplicated: Secondary | ICD-10-CM

## 2013-09-05 DIAGNOSIS — L718 Other rosacea: Secondary | ICD-10-CM | POA: Insufficient documentation

## 2013-09-05 MED ORDER — DOXYCYCLINE HYCLATE 50 MG PO CAPS: ORAL_CAPSULE | ORAL | Status: AC

## 2013-09-05 MED ORDER — PREDNISONE (PAK) 10 MG PO TABS: ORAL_TABLET | Freq: Every day | ORAL | Status: AC

## 2013-09-05 NOTE — Progress Notes (Unsigned)
Patient ID: Kenneth PigeonSteven Good, male   DOB: [birth year], 48 y.o.   MRN: [id number] S-C/O recurrent rash papular discrete covering trunk.No sore throat,Pt has hx of sensitive/dry skin/eczemza.C/O rash burning at nite and after shower. Using skin lotion/moisturizer.Dental RX canceled  O  Diffuse/discrete 1 mm papular rash on trunk A -Acne form rash P RX Pred pak and doxycycline 7 days

## 2013-09-05 NOTE — Progress Notes (Signed)
    Daily Group Progress Note  Program: CD-IOP   Group Time: 1:00-2:30pm  Participation Level: Active  Behavioral Response: Appropriate  Type of Therapy: Process Group  Topic: Group members checked in by sharing their names and sobriety dates.  Counseling intern led 5 minutes of relaxation time using HeartMath.  Counselor then helped the two group members who relapsed process what led to the relapses and how they could have prevented them.  Two new group members shared their stories.  Other group members shared how they were doing and discussed issues and concerns about early recovery, including having difficulty saying "no" to requests from others.     Group Time: 2:45-4:00pm  Participation Level: Active  Behavioral Response: Appropriate  Type of Therapy: Psycho-education Group  Topic: Counseling intern presented information about the 4 phases of recovery: bottoming out, ambivalence, commitment, and integration.  She also explained the reasons it is important to understand what to expect during each phase. Group members discussed their own experiences with phases of recovery and completed a checklist to help them determine which phase they are in.  They also discussed what they learned about themselves and their recovery from completing the checklist.   Summary: Patient reported a sobriety date of 11/28.  He reported that he had attended meetings to support his recovery.  He also shared that he was preparing to have his second dental surgery tomorrow, and was discussing pain management options for his upcoming third, more difficult surgery.  During the psychoeducation session, he shared that understanding what to expect as he recovered from his jaw injury helped him feel more positive about it, similar to understanding the phases of recovery.  He reported that he feels that when it comes to his recovery, he has "got it" and just needs to continue his recovery activities.   Family  Program: Family present? No   Name of family member(s): n/a  UDS collected: Yes Results: negative  AA/NA attended?: YesMonday and Tuesday  Sponsor?: Yes   [facility]

## 2013-09-07 ENCOUNTER — Encounter (HOSPITAL_COMMUNITY): Payer: Self-pay | Admitting: Psychology

## 2013-09-07 NOTE — Progress Notes (Signed)
    Daily Group Progress Note  Program: CD-IOP   Group Time: 1-2:30 pm  Participation Level: Minimal  Behavioral Response: Appropriate  Type of Therapy: Psycho-education Group  Topic: Guest Speaker: Pharmacist. The first part of group was spent with a guest speaker. Einar Grad, the pharmacist upstairs in the [facility] spent time with the group. She discussed drugs that contribute to great harm and how they affect the body and mind. She also provided education about mediations that are typically prescribed to those in early recovery and what they do to help ease distress and instability so often reported. There was excellent feedback and questions from members. During this time the medical director, Darlyne Russian, PA, met with 2 new patients and current members who were needed to discuss current medications. The group seemed very pleased with the information this visitor had provided to them.    Group Time: 2:45-4pm  Participation Level: Active  Behavioral Response: Sharing  Type of Therapy: Process Group  Topic: Group Process: The second half of group was spent in process. Members shared about current issues and concerns. One of the newer members shared about what she had done to remain drug-free since the last session. Another member explained what he was going to do differently this time in early recovery. The importance of developing a daily routine that one consistently follows was emphasized during this session. As the session neared an end, members shared about plans for the upcoming weekend.   Summary: the patient was attentive but said little during the presentation with the guest speaker. At the conclusion of her visit, he noted that it was very interesting despite the fact that he doesn't take any medications. In process, the patient agreed that he is very careful about where he goes and who he is with. He shared his plans for the upcoming weekend and I pointed out how this patient is  a real creature of habit. Steve's weekend plans are always the same - AA meetings, speaking with sponsor, and attending church. He continues to make excellent progress in his recovery and his sobriety date remains 11/28.   Family Program: Family present? No   Name of family member(s):   UDS collected: No Results: every drug test has been negative  AA/NA attended?: YesWednesday, Thursday and Friday  Sponsor?: Yes   Nimah Uphoff, LCAS

## 2013-09-08 ENCOUNTER — Other Ambulatory Visit (HOSPITAL_COMMUNITY): Payer: No Typology Code available for payment source | Admitting: Psychology

## 2013-09-08 ENCOUNTER — Other Ambulatory Visit (HOSPITAL_COMMUNITY): Payer: Self-pay

## 2013-09-08 DIAGNOSIS — F1020 Alcohol dependence, uncomplicated: Secondary | ICD-10-CM

## 2013-09-10 ENCOUNTER — Other Ambulatory Visit (HOSPITAL_COMMUNITY): Payer: No Typology Code available for payment source | Admitting: Psychology

## 2013-09-10 ENCOUNTER — Other Ambulatory Visit (HOSPITAL_COMMUNITY): Payer: Self-pay

## 2013-09-10 ENCOUNTER — Encounter (HOSPITAL_COMMUNITY): Payer: Self-pay | Admitting: Psychology

## 2013-09-10 DIAGNOSIS — F1020 Alcohol dependence, uncomplicated: Secondary | ICD-10-CM

## 2013-09-10 NOTE — Progress Notes (Signed)
    Daily Group Progress Note  Program: CD-IOP   Group Time: 1-2:30 pm  Participation Level: Active  Behavioral Response: Appropriate and Sharing  Type of Therapy: Process Group  Topic: Group Process: the first part of group was spent in process. Members shared about the past weekend and any issues or concerns they were having. One member seemed very emotional and was crying. She finally shared that she had been on an emotional roller coaster and had cried most of the weekend. Group members provided good feedback and a number of them verified that they had also struggled with emotional imbalance and lots of crying. There was good discussion and feedback among the group. Also present today were two students from the [employer]. They are here for a rotation.   Group Time: 2:45- 4pm  Participation Level: Active  Behavioral Response: Sharing  Type of Therapy: Psycho-education Group  Topic: Deactivation or Extinguishing of Cravings/Graduation: A psycho Ed was presented in the second half of group. Handouts were provided, including a brief case study of a man who had relapsed. Members read the case out loud and then they made observations about what had occurred. The group was able to identify a number of problems this man had made from the moment he got out of bed. It was a good study and it invited members to share how they had done similar things in the past. At the end of the session, a graduation ceremony was held for a member graduating from the program. Kind words of hope and encouragement were shared along with brownies and tears. The session proved very powerful and members left clearly touched by this session.    Summary: The patient reported he had spent most of the weekend with his mother. She was up visiting from [place]. They had dinner and she had taken him to the grocery store and stocked up his refrigerator. He attended meetings Friday, Saturday and Sunday and had met  with his sponsor. He noted that he had attended the [place] meeting this morning at 10 and then had lunch with two other group members.  He provided good support and feedback to another member who was struggling with withdrawal symptoms and PAWS. The patient admitted that he had detoxed himself at his apartment and was unable to get warm despite being under all the blankets he had. In the second half of group, the patient noted he did not dare go to some of the old bars he had once spent most of his evenings in. Just the thought of going made him uncomfortable. The patient continues to make excellent progress in his recovery and remains compliant in all aspects of the program and his sobriety date remains 11/28.     Family Program: Family present? No   Name of family member(s):   UDS collected: No Results:   AA/NA attended?: YesMonday, Friday, Saturday and Sunday  Sponsor?: Yes   Cecilee Rosner, LCAS

## 2013-09-12 ENCOUNTER — Other Ambulatory Visit (HOSPITAL_COMMUNITY): Payer: No Typology Code available for payment source | Admitting: Psychology

## 2013-09-12 ENCOUNTER — Other Ambulatory Visit (HOSPITAL_COMMUNITY): Payer: Self-pay

## 2013-09-12 DIAGNOSIS — F1020 Alcohol dependence, uncomplicated: Secondary | ICD-10-CM

## 2013-09-14 ENCOUNTER — Encounter (HOSPITAL_COMMUNITY): Payer: Self-pay

## 2013-09-15 ENCOUNTER — Encounter (HOSPITAL_COMMUNITY): Payer: Self-pay | Admitting: Psychology

## 2013-09-15 ENCOUNTER — Other Ambulatory Visit (HOSPITAL_COMMUNITY): Payer: Self-pay

## 2013-09-15 NOTE — Progress Notes (Signed)
    Daily Group Progress Note  Program: CD-IOP   Group Time: 1-2:30 pm  Participation Level: Minimal  Behavioral Response: Sharing  Type of Therapy: Process Group  Topic:  Group members checked in by sharing their names and sobriety dates.  Counselors led 5 minutes of HeartMath to promote focus and relaxation.  Several group members shared about difficulties they were experiencing in early recovery, including sleep issues, relationship problems, and selecting sponsors.  Chaplain Theda BelfastBob Hamilton then led a lesson on developing spiritual practices, involving the group members in discussions of their current practices and leading them through examples.    Group Time: 2:45- 4pm  Participation Level: Minimal  Behavioral Response: Sharing  Type of Therapy: Psycho-education Group  Topic: Counselors led a discussion on the 10 most common dangers in early recovery and had group members complete a worksheet on situations that are triggering for them.  Counselors reviewed each of the common dangers and had the group members discuss the reasons that each one can trigger cravings.  Group members also shared about other situations that are dangerous for them in early recovery, as well as situations that are becoming less dangerous over time as they develop better coping skills.  Summary: Patient reported a sobriety date of 11/28.  He reported that he had attended a speaker meeting last night and enjoyed it.  During the chaplain's presentation he shared that he attends church weekly and also considers AA meetings a spiritual practice.  He shared during the psychoeducation session that he is no longer as triggered by negative emotions and boredom as he used to be.  He reported that he has learned to manage these feelings by working through them.  Patient continues to do well in his recovery, although he seems reluctant to share with the group in a deeper way.   Family Program: Family present? No   Name  of family member(s):   UDS collected: No Results:   AA/NA attended?: YesMonday, Tuesday and Wednesday  Sponsor?: Yes   Vanice Rappa, LCAS

## 2013-09-17 ENCOUNTER — Other Ambulatory Visit (HOSPITAL_COMMUNITY): Payer: No Typology Code available for payment source | Admitting: Psychology

## 2013-09-17 ENCOUNTER — Other Ambulatory Visit (HOSPITAL_COMMUNITY): Payer: Self-pay

## 2013-09-17 DIAGNOSIS — F1020 Alcohol dependence, uncomplicated: Secondary | ICD-10-CM

## 2013-09-18 ENCOUNTER — Encounter (HOSPITAL_COMMUNITY): Payer: Self-pay | Admitting: Psychology

## 2013-09-18 NOTE — Progress Notes (Unsigned)
    Daily Group Progress Note  Program: CD-IOP   Group Time: 1:00-2:30pm  Participation Level: Active  Behavioral Response: Appropriate and Sharing  Type of Therapy: Process Group  Topic: Group members shared their names and sobriety dates, and counselor led them through a 5-minute mindfulness breathing exercise.  Group members breathed deeply while focusing on the words "just this one moment."  Counselor then invited group members to share about how they were doing in their recovery.  They discussed meetings they had attended and shared about other recovery activities.  They also discussed how the recent inclement weather had affected them.  A new group member shared his story, and other group members welcomed him to the group.     Group Time: 2:45-4:00pm  Participation Level: Active  Behavioral Response: Appropriate  Type of Therapy: Psycho-education Group  Topic: Counseling intern provided a handout on early warning signs of relapse and led a discussion on the cognitive, behavioral, and emotional warning signs.  Group members shared about their personal warning signs.  Counseling intern then demonstrated the green light/yellow light/red light activity, with the green light representing positive recovery activities, yellow light representing early warning signs of relapse, and red light representing relapse.  Group members brainstormed ideas about each area.  Several group members reported that the visual representation of their recovery activities and warning signs was helpful.  Counseling intern encouraged them to use the image to monitor their behaviors.   Summary: Patient reported a sobriety date of 11/28.  He shared that he had attended his usual weekend meetings and church, and had met other group members for lunch after a meeting today.  Counselor inquired about what step he was working on, and he responded that he was on step 6.  He shared about his experience with the 4th  step, calling it "rough."  He expressed confidence that future steps would be easier, and group members who had previously been through the steps reminded him that they may still be difficult.  During the psychoeducation group, he shared about his positive recovery activities and noted that his biggest warning sign for relapse was stopping those activities.  Patient continues to do well in his recovery and in group sessions.   Family Program: Family present? No   Name of family member(s): n/a  UDS collected: Yes Results: not yet available  AA/NA attended?: YesMonday and Tuesday  Sponsor?: Yes   [facility]

## 2013-09-18 NOTE — Progress Notes (Signed)
    Daily Group Progress Note  Program: CD-IOP   Group Time: 1-2:30 pm  Participation Level: Minimal  Behavioral Response: Sharing  Type of Therapy: Process Group  Topic: Group Process. The first part of group was spent in process. Members shared about the past few days and any obstacles or challenges to their sobriety. A new member was present and he was asked to introduce himself. During this time, the medical director was meeting with new patients and current group members who wanted to discuss any concerns with their medications.   Group Time: 2:45- 4pm  Participation Level: Active  Behavioral Response: Appropriate and Sharing  Type of Therapy: Activity Group  Topic: Group Activity: "Getting to Know You". The second part of group was spent in an activity that would contribute to members knowing more about each other and do it in a funny and pleasurable way. Members were asked to write 3 things about themselves, with only one of the 3 statements actually true. They're notes were placed in a basket and members drew them out of the basket, read them, and tried to guess who the Thereasa Parkinauthor was. The session proved to be a light-hearted, but very revealing session.    Summary: The patient had undergone oral surgery yesterday and although he was a little sore, he insisted he had not taken any pain medications. He had had 3 root canals. He reported having attended a meeting this morning and being glad he did. Another group member had picked him up. He said little as the new group member shared and the patient seemed taken aback by the fellow's excuses. In the activity, the patient laughed and shared with the group. He received good feedback and was one of the leaders of the group. He has made good progress in early recovery and his sobriety date remains 11/18.    Family Program: Family present? No   Name of family member(s):   UDS collected: No Results:   AA/NA attended?: YesWednesday and  Friday  Sponsor?: Yes   Reily Ilic, LCAS

## 2013-09-19 ENCOUNTER — Other Ambulatory Visit (HOSPITAL_COMMUNITY): Payer: No Typology Code available for payment source | Admitting: Psychology

## 2013-09-19 ENCOUNTER — Encounter (HOSPITAL_COMMUNITY): Payer: Self-pay | Admitting: Psychology

## 2013-09-19 ENCOUNTER — Other Ambulatory Visit (HOSPITAL_COMMUNITY): Payer: Self-pay

## 2013-09-19 DIAGNOSIS — F1020 Alcohol dependence, uncomplicated: Secondary | ICD-10-CM

## 2013-09-21 ENCOUNTER — Encounter (HOSPITAL_COMMUNITY): Payer: Self-pay | Admitting: Psychology

## 2013-09-21 NOTE — Progress Notes (Signed)
    Daily Group Progress Note  Program: CD-IOP   Group Time: 1-2:30 pm  Participation Level: Minimal  Behavioral Response: Sharing  Type of Therapy: Process Group  Topic: Group process: the first part of group was spent in process. Members checked in on current issues, concerns, and obstacles in early recovery. One member appeared today, who had not been present on Wednesday. This young woman had checked in with a new sobriety date.   . A new group member provided some feedback and, as the session progressed, and he talked more, he seemed to know everything about recovery. His dominating conversation was clearly irritating other members and when I finally asked a member what she was feeling, she admitted she was 'aggravated'. She explained that someone was doing most of the talking and she found it frustrating. Another member noted that if he knew so much about recovery, what was he doing in this group? The challenges brought the entire group to life and there was a lengthy discussion about the role of group and what is and is not expected from group members. The session proved therapeutic and revealing for all present. During this session, the medical director was meeting with two members scheduled to discharge next week along with two new group members.   Group Time: 2:45- 4pm  Participation Level: Minimal  Behavioral Response: Sharing  Type of Therapy: Psycho-education Group  Topic: The second half of group was spent in a psycho-ed about early recovery and PEPS; an acronym for participation, enthusiasm, practice, and support. A more thorough presentation on these 4 aspects of a recovery lifestyle were discussed at length. Following was a handout provided members listing different activities people might consider in early recovery. During this half of group, cupcakes were passed around and a candle on one presented to a member whose birthday was today. She made a wish, blew out the candle,  and the group sang 'Happy Birthday'. Members shared the things they did and discussed the activities they would like to consider in the future. The group laughed as they discussed hobbies they hoped to return to once more or begin anew. As the session ended, members shared with each other their recovery plans for the weekend.   Summary: The patient reported he was doing okay. He admitted that this morning, it was so cold his mouth was literally stiff. He has had multiple surgeries since falling this summer on concrete steps face first. He reported the cold makes it worse and it was aching. He continues to attend meetings and reaches out to others. He had helped orchestrate the lunch today for one of his fellow group members' whose birthday is today. The patient was subdued during the discussion about dominating the conversation, but admitted he is not one to speak much. In the second half of group, the patient reported he loved to play golf and is a good cook. He continues to make good progress in sobriety, but he remains emotionally shut down and distant. It is unclear whether he even realizes this. The patient's sobriety date is 11/28.  Family Program: Family present? No   Name of family member(s):   UDS collected: No Results:  AA/NA attended?: YesWednesday, Thursday and Friday  Sponsor?: Yes   [name], LCAS

## 2013-09-22 ENCOUNTER — Other Ambulatory Visit (HOSPITAL_COMMUNITY): Payer: Self-pay

## 2013-09-22 ENCOUNTER — Other Ambulatory Visit (HOSPITAL_COMMUNITY): Payer: No Typology Code available for payment source | Admitting: Psychology

## 2013-09-22 DIAGNOSIS — F1020 Alcohol dependence, uncomplicated: Secondary | ICD-10-CM

## 2013-09-23 ENCOUNTER — Encounter (HOSPITAL_COMMUNITY): Payer: Self-pay | Admitting: Psychology

## 2013-09-23 NOTE — Progress Notes (Signed)
    Daily Group Progress Note  Program: CD-IOP   Group Time: 1-2:30 pm  Participation Level: Active  Behavioral Response: Appropriate and Sharing  Type of Therapy: Process Group  Topic: Group Process: the first part of group was spent in process. Members shared about current issues and challenges in early recovery. One member reported she had been doing some 'stinkin thinking'. Another member reported he had been enabling his cousin and his girlfriend was using and he had finally realized he needed to save himself. Another member reported he had been around a bunch of people drinking, but he enjoyed his ginger ale and didn't get tempted. No one had relapsed over the weekend. Random drug tests were collected.   Group Time: 2:45- 4 pm  Participation Level: Minimal  Behavioral Response: Sharing  Type of Therapy: Psycho-education Group  Topic: Group Process: Core Beliefs. Second part of group was spent in a psycho-ed session focusing on Core Beliefs. A handout was provided explaining how Core Beliefs first develop. The handout was read by group members and examples provided. When asked to share some of their own Core Beliefs, one member shared that her CB was "I'm not good enough". The group assisted her in identifying evidence contrary to this belief, including one member telling her, "You are more than good enough", and another explaining that being around her "makes me feel good enough". I emphasized the foundational presence of negative Core Beliefs as the fuel that feeds one's addiction. Group members were very empowered and energized in recognizing the power of these Core Beliefs and how they might begin to challenge and change theirs.   Summary: The patient reported he had attended meetings every day since Friday. He also speaks with his sponsor daily. He pointed out that in the past 6 days he has attended 7 AA meetings. The patient also went to church. He provided good feedback to his  fellow group members. The patient shared little in the second half of group, but was attentive. The patient passed around the brownies to celebrate the graduation and shared kind words of hope with the graduating member.   Family Program: Family present? No   Name of family member(s):   UDS collected: Yes Results: not returned from lab yet  AA/NA attended?: [place], Friday, Saturday and Sunday  Sponsor?: Yes   Falon Huesca, LCAS

## 2013-09-24 ENCOUNTER — Other Ambulatory Visit (HOSPITAL_COMMUNITY): Payer: No Typology Code available for payment source

## 2013-09-24 ENCOUNTER — Other Ambulatory Visit (HOSPITAL_COMMUNITY): Payer: Self-pay

## 2013-09-24 DIAGNOSIS — F1020 Alcohol dependence, uncomplicated: Secondary | ICD-10-CM

## 2013-09-26 ENCOUNTER — Other Ambulatory Visit (HOSPITAL_COMMUNITY): Payer: No Typology Code available for payment source | Admitting: Psychology

## 2013-09-26 ENCOUNTER — Other Ambulatory Visit (HOSPITAL_COMMUNITY): Payer: Self-pay

## 2013-09-26 DIAGNOSIS — F1020 Alcohol dependence, uncomplicated: Secondary | ICD-10-CM

## 2013-09-26 NOTE — Progress Notes (Unsigned)
    Daily Group Progress Note  Program: CD-IOP   Group Time: 1:00-2:30pm  Participation Level: Active  Behavioral Response: Appropriate  Type of Therapy: Process Group  Topic: The first part of group was spent in process.  Group members shared their names and sobriety dates, and then counselor led 5 minutes of relaxation with HeartMath.  The group then discussed issues related to early recovery, including finding 12-step meetings that meet their needs.  Several group members shared concerns about the results of their recent drug tests, and one group member admitted to a relapse this past Sunday.  The group helped her process the relapse and shared their own experiences with relapse.     Group Time: 2:45-4:00pm  Participation Level: Active  Behavioral Response: Appropriate  Type of Therapy: Psycho-education Group  Topic: Counselor asked the group to revisit the Core Beliefs handout from the previous group session, explained the concept of core beliefs, and where they come from.  Counselor also shared a handout on "Ten Ways to Untwist your Thinking" and reviewed the first several points.  The group discussed methods to challenge negative and unhealthy core beliefs, for example by using compassionate self-talk and examining evidence for and against the core belief.  The session ended with a graduation ceremony for a member who has completed the program.   Summary: Patient's sobriety date remains 11/28.  He shared that he had a legal issue that he needed to take care of, which was causing some stress, and he had an appointment with his attorney to help him.  He also reported that he picked up his 90 day chip at a meeting yesterday, and the group congratulated him.  During the process group, he challenged a group member about dishonesty, emphasizing that withholding important information is a form of dishonesty.  Patient continues to do well in his recovery and is becoming more vocal in  group sessions.   Family Program: Family present? No   Name of family member(s): n/a  UDS collected: No Results: n/a  AA/NA attended?: OmanesMonday and Tuesday  Sponsor?: Yes   [facility]

## 2013-09-28 ENCOUNTER — Encounter (HOSPITAL_COMMUNITY): Payer: Self-pay

## 2013-09-28 ENCOUNTER — Encounter (HOSPITAL_COMMUNITY): Payer: Self-pay | Admitting: Psychology

## 2013-09-28 NOTE — Progress Notes (Signed)
    Daily Group Progress Note  Program: CD-IOP   Group Time: 1-2:30 pm  Participation Level: Active  Behavioral Response: Appropriate and Sharing  Type of Therapy: Process Group  Topic: Group Process; the first part of group was spent in process. Members shared about current issues and concerns in early recovery. Most group members had been snowed in yesterday and they disclosed what they had done to make the most of the time. There was good discussion among the group. Today the program director met with patients for discharge, medication questions as well as meeting with the two new group members.   Group Time: 2:45-4 pm  Participation Level: Active  Behavioral Response: Sharing  Type of Therapy: Psycho-education Group  Topic: Boundaries/Graduation: the second half of group was spent in a psycho-ed presentation on boundaries. The session was led by one of the [employer] interns working here in the outpatient program this year. A handout was provided and the group read through the handout together. A lively and very pertinent discussion followed, including defining boundaries, identifying different personality types, and the challenges these group members have in setting and respecting boundaries. The session proved important since so many men and women suffering from the chronic disease of addiction hake poor boundaries. As the session came to a close, the group celebrated a graduation. The member became teary as she recounted the benefits and joy she has experienced while here in the program.   Summary:The patient reported he had attended a meeting this morning and had spoken with his sponsor daily. He had not attended a meeting yesterday due to the snowy conditions, but accomplished a lot at his apartment. During the discussion on boundaries, the patient was asked to shared his experiences and feelings about his life. He admitted that his parents had never really helped him with anything. He  never really disclosed his feelings, but they weren't really interested. As a result, he learned to address any problems or issues by himself. The patient offered good feedback to his fellow group members and made some good comments. We continue to challenge him to disclose his inner feelings. Having grown up in an alcoholic household and become an alcoholic himself very young in life, he really doesn't understand 'feelings'. he shared kind and heartfelt words with the graduating member. This patient continues to make excellent progress in his recovery and his sobriety date remains 11/28.      Family Program: Family present? No   Name of family member(s):   UDS collected: No Results:  AA/NA attended?: YesFriday  Sponsor?: Yes   Yunis Voorheis, LCAS

## 2013-09-29 ENCOUNTER — Encounter (HOSPITAL_COMMUNITY): Payer: Self-pay | Admitting: Psychology

## 2013-09-29 ENCOUNTER — Other Ambulatory Visit (HOSPITAL_COMMUNITY): Payer: Self-pay

## 2013-09-29 ENCOUNTER — Other Ambulatory Visit (HOSPITAL_COMMUNITY): Payer: No Typology Code available for payment source | Attending: Psychiatry | Admitting: Psychology

## 2013-09-29 DIAGNOSIS — F1020 Alcohol dependence, uncomplicated: Secondary | ICD-10-CM

## 2013-09-30 ENCOUNTER — Encounter (HOSPITAL_COMMUNITY): Payer: Self-pay | Admitting: Psychology

## 2013-09-30 NOTE — Progress Notes (Unsigned)
Kenneth Good: CD-IOP: Treatment Plan Update. I met with the patient this morning before his group session. He has been doing very well in his recovery, but admitted that he hadn't slept well last night. He is scheduled to meet with his attorney today and she will decide whether they will go to court tomorrow or continue his case again. He was charged with a DUI in November, but the case has been continued and it appears as if the officer in charge made numerous legal errors and the case is likely to be dismissed. I explained to the patent the need for a treatment plan update. He is a scholarship recipient and has chosen to remain in the program longer than the typical time frame of treatment. The patient has done very well in the program and picked up his 90 day chip last Friday. The patient's first goal of treatment is sobriety. He has attained 93 days  As of today. His second goal relates to engagement in the 12-step community and the patient attends more than 7 meetings per week and is working Step 6 with his sponsor. His third treatment goal is to seek employment, but that has now changed. He has been accepted to a school to pursue his master's in [facility] and is on a full scholarship. His fourth and final goal was to resolve his pending DUI charge. He is doing everything he possibly can to resolve this charge and has not driven his car since the arrest. He travels by public transportation everywhere he goes. The documentation was reviewed, signed and completed. The treatment plan was updated accordingly. We will continue to follow closely in the days and weeks ahead.         Fonnie Crookshanks, LCAS

## 2013-09-30 NOTE — Progress Notes (Signed)
    Daily Group Progress Note  Program: CD-IOP   Group Time: 1-2:30 pm  Participation Level: Active  Behavioral Response: Appropriate and Sharing  Type of Therapy: Process Group  Topic: Group Process: The first part of group was spent in process. Members shared about the past weekend and any issues or concerns that came up in early recovery. A new group member was present and he disclosed some of his history and [facility]. There was good feedback and disclosure among the group. Also present today was a Educational psychologistA student from [employer]. She introduced herself and engaged in the discussion.   Group Time: 2:45- 4pm  Participation Level: patient absent  Behavioral Response: patient left at break  Type of Therapy: Psycho-education Group  Topic: Bio-Psycho-Social- Spiritual: the second half of group was spent in a psycho-ed on the 4 elements that contribute to the formation of an addiction. Each of the 4 elements was discussed in detail. Of these 4, it was emphasized that only the biological cannot be changed. Each of the remaining 3 elements can be directly addressed in many different ways. Members identified what they intend to do to address and change the 3 elements that they can change. During this half of group, the PA shared some of her story and described her father as an alcoholic who denies the damage his drinking has caused to the family as well as to his own health.   Summary: The patient reported a good weekend with meetings and conversations with his sponsor. He pointed out that the minister of his church had given him a key so he can go in and get the room ready for [employer] meetings. I pointed out the degree of trust that this man has earned despite having just 90 days of sobriety. The patient reported he has to leave early today in order to get the bus downtown for a meeting with his attorney. He reminded the group that he has a pending DUI charge and his  attorney has been continuing the case until she is ready to present. He admitted that he is anxious about what is going to happen and noted he could go to jail for 30 days. The patient provided good feedback to one of the newer members and explained that he had had crazy vivid dreams for the first 60 days of his recovery, but they are not as frequent now. The patient left at the break and agreed to contact fellow group members about the plan for his court date. He continues to make excellent progress in his recovery having picked up his 90 day chip recently. His sobriety date remains 11/28.    Family Program: Family present? No   Name of family member(s):   UDS collected: No Results: every drug test has been negative  AA/NA attended?: YesFriday, Saturday and Sunday  Sponsor?: Yes   Novi Calia, LCAS

## 2013-10-01 ENCOUNTER — Other Ambulatory Visit (HOSPITAL_COMMUNITY): Payer: No Typology Code available for payment source

## 2013-10-01 ENCOUNTER — Other Ambulatory Visit (HOSPITAL_COMMUNITY): Payer: Self-pay

## 2013-10-01 DIAGNOSIS — F1020 Alcohol dependence, uncomplicated: Secondary | ICD-10-CM

## 2013-10-03 ENCOUNTER — Other Ambulatory Visit (HOSPITAL_COMMUNITY): Payer: No Typology Code available for payment source | Admitting: Psychology

## 2013-10-03 ENCOUNTER — Other Ambulatory Visit (HOSPITAL_COMMUNITY): Payer: Self-pay

## 2013-10-03 DIAGNOSIS — F1020 Alcohol dependence, uncomplicated: Secondary | ICD-10-CM

## 2013-10-03 NOTE — Progress Notes (Unsigned)
    Daily Group Progress Note  Program: CD-IOP   Group Time: 1:00-2:30pm  Participation Level: Active  Behavioral Response: Appropriate  Type of Therapy: Process Group  Topic: The first half of group was spent in process.  Group members introduced themselves and shared their sobriety dates, and then were invited by the counselor to check in.  They discussed issues related to early recovery including building up a support network, handling stress, and improving family relationships.  One group member inquired about managing resentments, and the group discussed this topic in detail.  A new group member introduced himself, shared his history, and explained the reasons he was in treatment.       Group Time: 2:45-4:00pm  Participation Level: Active  Behavioral Response: Appropriate  Type of Therapy: Psycho-education Group  Topic: Counselor provided two handouts related to resentments and forgiveness in order to continue the discussion from the first half of group.  Group members read the first handout, which was on the effects of resentments and meaning of forgiveness, and discussed what they learned from it.  Counselor emphasized that resentments hurt the person who holds onto them and can contribute to relapse.  Group members also read and discussed the poem "Anyway" by Mother Clarene Critchley.  At the end of the session, counselor asked each group member to share what they had learned from the session.   Summary: Patient reported a sobriety date of 11/28.  He shared that he had met with his attorney on Monday, who attended court on his behalf on Tuesday.  He reported that the judge decided to postpone his decision about the patient's DUI until he completes treatment.  Patient reported that he was very pleased and relieved by this result.  During the psychoeducation group, patient shared about resentment that he has toward his parents and the work he has done toward forgiving them.  Patient  continues to do well in his recovery and is becoming more open with the group.   Family Program: Family present? No   Name of family member(s): n/a  UDS collected: Yes Results: not yet available  AA/NA attended?: YesMonday and Tuesday  Sponsor?: Yes   [facility]

## 2013-10-06 ENCOUNTER — Encounter (HOSPITAL_COMMUNITY): Payer: Self-pay | Admitting: Psychology

## 2013-10-06 ENCOUNTER — Other Ambulatory Visit (HOSPITAL_COMMUNITY): Payer: Self-pay

## 2013-10-06 ENCOUNTER — Encounter (HOSPITAL_COMMUNITY): Payer: Self-pay

## 2013-10-06 ENCOUNTER — Other Ambulatory Visit (HOSPITAL_COMMUNITY): Payer: No Typology Code available for payment source | Admitting: Psychology

## 2013-10-06 DIAGNOSIS — F1020 Alcohol dependence, uncomplicated: Secondary | ICD-10-CM

## 2013-10-06 NOTE — Progress Notes (Signed)
    Daily Group Progress Note  Program: CD-IOP   Group Time: 1-2:30 pm  Participation Level: Active  Behavioral Response: Appropriate and Sharing  Type of Therapy: Process Group  Topic: Group Process/Relapse Process: the first part of group was spent in process. Members shared about current issues and concerns in recovery. The newest member, in just his second group session, shared about a woman he had spent time with. This generated a discussion about focusing on recovery and avoiding distractions. There was good feedback and sharing among members. After process, members were presented with a brief psycho-ed on the 4-step relapse process - trigger, thoughts, cravings, and use. The important of addressing thoughts of using as soon as they occur was emphasized. During this time, the program director was meeting with current members regarding medications and the two new members.  Group Time: 2:45- 4pm  Participation Level: Active  Behavioral Response: Sharing  Type of Therapy: Psycho-education Group  Topic: Cravings and Triggers: In the second half of group, a Matrix slide show was shown on the topics of cravings and triggers. The slide show focused on the changes that occur with increased drug use and the associations that become stronger as one's use grows. Members were invited to share observations or their own experiences as their addiction was taking over more control. The group proved very informative and insightful to the group. The session ended with members sharing their plans for the weekend.   Summary: The patient reported he had attended his usual meetings since our last group. He reported he is feeling well and remains very engaged with his sponsor and his wide network of support. He identified the feeling of anger as his most powerful trigger. Although, when I challenged him, he has been drinking so long that it is unclear whether she is really in touch with his feelings. The  patient is very kind and supportive of his fellow group members and provided a listing of everyone's phone number to the newest group members. His sobriety date is 11/28.    Family Program: Family present? No   Name of family member(s):   UDS collected: No Results:  AA/NA attended?: YesWednesday, Thursday and Friday  Sponsor?: Yes   Gianah Batt, LCAS

## 2013-10-08 ENCOUNTER — Other Ambulatory Visit (HOSPITAL_COMMUNITY): Payer: No Typology Code available for payment source | Admitting: Psychology

## 2013-10-08 ENCOUNTER — Other Ambulatory Visit (HOSPITAL_COMMUNITY): Payer: Self-pay

## 2013-10-08 ENCOUNTER — Encounter (HOSPITAL_COMMUNITY): Payer: Self-pay | Admitting: Psychology

## 2013-10-08 DIAGNOSIS — F1020 Alcohol dependence, uncomplicated: Secondary | ICD-10-CM

## 2013-10-08 NOTE — Progress Notes (Signed)
    Daily Group Progress Note  Program: CD-IOP   Group Time: 1-2:30 pm  Participation Level: Active  Behavioral Response: Appropriate  Type of Therapy: Process Group  Topic: Group Process: The first part of group was spent in process. Members shared about the past weekend and any issues or concerns in early recovery. One member shared that he had relapsed over the weekend. He admitted he hadn't realized how much alcohol and marijuana are a part of his daily life. The group assured him that this happens and it will require him to more conscious of his relationship with chemicals. Other members talked about denial and the importance of being honest. One member admitted she is fighting cravings to smoke cigarettes and she concluded that it was really cannabis she was craving. The session was very informative and compelling.   Group Time: 2:45-4pm  Participation Level: Active  Behavioral Response: Sharing  Type of Therapy: Psycho-education Group  Topic: "The Addiction Process": The second half of group was spent in a psycho-ed. Members observed a brief clip from the film, Addiction, an HBO special. It included an explanation of what happens to one's brain after continued drug and alcohol use. The changes in the brain were very clearly identified in the film and members gained a more clear understanding of the physical nature of their chronic illness. The session proved very productive to everyone present.   Summary:The patient reported he had a good weekend with meetings, church and corresponding with others in recovery. He noted they had met a former group member for lunch on Saturday and a few of the other group members had also gone for lunch. He also attended the [facility] tournament with a ticket provided by another group member. The patient was able to identify with the CD about the biological ramifications of addiction and could relate to his absence of reflection or thought when he was  drinking. The patient continues to make good progress in his recovery and he responded well to this session. His sobriety date is 11/28.    Family Program: Family present? No   Name of family member(s):   UDS collected: No Results:   AA/NA attended?: YesMonday, Friday and Saturday  Sponsor?: Yes   Chia Mowers, LCAS

## 2013-10-10 ENCOUNTER — Encounter (HOSPITAL_COMMUNITY): Payer: Self-pay

## 2013-10-10 ENCOUNTER — Other Ambulatory Visit (HOSPITAL_COMMUNITY): Payer: No Typology Code available for payment source | Admitting: Psychology

## 2013-10-10 ENCOUNTER — Other Ambulatory Visit (HOSPITAL_COMMUNITY): Payer: Self-pay

## 2013-10-10 DIAGNOSIS — F1020 Alcohol dependence, uncomplicated: Secondary | ICD-10-CM

## 2013-10-10 NOTE — Progress Notes (Unsigned)
    Daily Group Progress Note  Program: CD-IOP   Group Time: 1:00-2:30pm  Participation Level: Active  Behavioral Response: Appropriate  Type of Therapy: Process Group  Topic:Group members checked in by sharing their names and sobriety dates.  Counselor then used a 5-minute guided relaxation to help group members relax and focus.  Group members discussed issues relevant to early recovery, including dealing with denial and thoughts of using, managing difficult emotions, and building a support network.  One group member played a song about recovery and provided copies of the lyrics, and the group discussed the song briefly.  Counselor also provided a handout on PAWS and briefly reviewed the symptoms.    Group Time: 2:45-4:00pm  Participation Level: Active  Behavioral Response: Appropriate  Type of Therapy: Psycho-education Group  Topic: Counselor played a 20-minute video on self-compassion and led a discussion on the topic.  Group members discussed the differences between self-esteem and self-compassion, the components of self-compassion, negative self-talk, and the difficulties in providing oneself with self-compassion.  Counselor then led a 5-minute "self-compassion break," a guided meditation that included the 3 self-compassion components: mindfulness, common humanity, and self-kindness.  Group members shared briefly about their experience with the meditation.  Summary: Patient's sobriety date remains 11/28.  He reported that a friend had provided him with tickets to the [facility], and he had enjoyed attending the games so far.  He also shared that he appreciated that there was no alcohol served at the coliseum.  He provided helpful feedback and encouragement to other group members.  During the psychoeducation activity, he shared that self-compassion was difficult for him because it is so different from the way he was raised.  Patient continues to do well in his  recovery.   Family Program: Family present? No   Name of family member(s): n/a  UDS collected: Yes Results: negative  AA/NA attended?: YesMonday and Tuesday  Sponsor?: Yes   [facility]

## 2013-10-11 ENCOUNTER — Encounter (HOSPITAL_COMMUNITY): Payer: Self-pay | Admitting: Psychology

## 2013-10-11 NOTE — Progress Notes (Signed)
    Daily Group Progress Note  Program: CD-IOP   Group Time: 1-2:30 pm  Participation Level: Active  Behavioral Response: Appropriate and Sharing  Type of Therapy: Process Group  Topic: Group members checked in by sharing their names and sobriety dates.  Counselor then led group in 5 minutes of HeartMath to promote focus and calm.  The group discussed topics relevant to early recovery, including family issues and dealing with cravings and thoughts of using.  One group member also processed a recent relapse and received feedback from the group.  Chaplain Leola Brazilatricia Wright then arrived to group and led a discussion on values and spiritual beliefs.  She included an activity in which group members wrote a belief on a stone that they could take with them  Group Time: 2:45- 4pm  Participation Level: Active  Behavioral Response: Appropriate and Sharing  Type of Therapy: Psycho-education Group  Topic: Group members continued the discussion on beliefs and values.  Counselor challenged them to identify ways that they could live out their values in their daily lives.  She emphasized that during active addiction, a person's values are pushed aside in favor of substances. Several group members shared about their values and the ways that they demonstrate them in their daily lives.  They also identified challenges and next steps toward living in line with their values.  At the end of the session, counselor led a graduation ceremony for a member who has completed the program.  Summary: Patient's sobriety date remains 11/28.  He shared that he had a meeting with his attorney earlier, and that they were working toward improving his plea deal.  He reported that his attorney's goal was to get him a deal with no jail time.  He shared that he has some anxiety about this, but overall feels hopeful.  During the chaplain's presentation, he shared that he wrote "follow through" on his stone.  He explained that he  believed he knew what to do to stay sober, and that he had to commit to it fully in order to succeed.  Patient continues to do well in his recovery and is becoming more open with the group.   Family Program: Family present? No   Name of family member(s):   UDS collected: No Results:   AA/NA attended?: YesMonday, Tuesday and Wednesday  Sponsor?: Yes   Kenneth Good, LCAS

## 2013-10-13 ENCOUNTER — Other Ambulatory Visit (HOSPITAL_COMMUNITY): Payer: Self-pay

## 2013-10-13 ENCOUNTER — Other Ambulatory Visit (HOSPITAL_COMMUNITY): Payer: No Typology Code available for payment source | Admitting: Psychology

## 2013-10-13 ENCOUNTER — Encounter (HOSPITAL_COMMUNITY): Payer: Self-pay | Admitting: Psychology

## 2013-10-13 DIAGNOSIS — F1020 Alcohol dependence, uncomplicated: Secondary | ICD-10-CM

## 2013-10-14 ENCOUNTER — Encounter (HOSPITAL_COMMUNITY): Payer: Self-pay | Admitting: Psychology

## 2013-10-14 NOTE — Progress Notes (Signed)
    Daily Group Progress Note  Program: CD-IOP   Group Time: 1-2:30 pm  Participation Level: Active  Behavioral Response: Appropriate and Sharing  Type of Therapy: Process Group  Topic: Group Process: First part of group was spent in process. Members shared about the past weekend and disclosed issues and concerns that they have faced over the weekend. One member shared that a previous member who had graduated successfully had relapsed. He was very upset. I read to the group from Al-Anon. the reading focused on detachment and recognizing nothing we say or do will stop another from drinking or drugging. The group processed their sense of helplessness and it proved to be a session with a different perspective and one that these members have not typically experienced.   Group Time: 2:45-4 pm  Participation Level: Active  Behavioral Response: Sharing  Type of Therapy: Psycho-education Group  Topic: Wheel of Life: The second half of group was spent in a psycho-ed called the Wheel of Life. Members were asked to come to the board and draw where they stand on each of the 8 categories of life. After drawing their 'wheel' they discussed each of the categories and how they are addressing this part of their life. The session proved revealing in many ways. Every member acknowledged having very little 'Fun & Recreation'. Many have been drinking or drugging for so long they don't know what to do with themselves in early sobriety. It proved to be a revealing session and provoked good discussion and feedback among members.   Summary: The patient reported he had had a busy weekend attending all of the [facility]. He had also gone to meetings, church and a large gathering for a birthday party for someone with many years of sobriety. The patient expressed frustration about the news that a former group member had relapsed. He appeared ready to drive out of state and 'rescue' her. I read  something from Al-Anon and reminded him and the other members that no one can stop another from drinking. I encouraged him to send prayers to this member and seek her out when she has stopped using and landed in detox. He seemed to let go of the urgency he had previously displayed. The patient didn't draw his wheel as we ran out of time, but he did note that it was pretty balanced and rounder than most. The patient continues to share openly about his issues and concerns and displays a concerned and caring for his fellow group members that seems to invite others to feel the same. He continues to make excellent progress and his sobriety date is 11/28.    Family Program: Family present? No   Name of family member(s):   UDS collected: No Results:   AA/NA attended?: YesMonday, Friday, Saturday and Sunday  Sponsor?: Yes   Bao Coreas, LCAS

## 2013-10-15 ENCOUNTER — Other Ambulatory Visit (HOSPITAL_COMMUNITY): Payer: No Typology Code available for payment source | Admitting: Psychology

## 2013-10-15 ENCOUNTER — Other Ambulatory Visit (HOSPITAL_COMMUNITY): Payer: Self-pay

## 2013-10-15 DIAGNOSIS — F1020 Alcohol dependence, uncomplicated: Secondary | ICD-10-CM

## 2013-10-16 NOTE — Progress Notes (Unsigned)
    Daily Group Progress Note  Program: CD-IOP   Group Time: 1:00-2:30pm  Participation Level: Active  Behavioral Response: Appropriate  Type of Therapy: Process Group  Topic: Group members checked in by sharing their names and sobriety dates.  Counselor led group in 5 minutes of HeartMath breathing in order to promote relaxation and focus.  Group members then discussed how they were doing and shared about issues related to early recovery, including managing volatile emotions and PAWS symptoms, grief and loss, and making friends with others in recovery.  Three new group members were present, and each one shared about their reasons for seeking treatment in the program.       Group Time: 2:45-4:00pm  Participation Level: Active  Behavioral Response: Appropriate  Type of Therapy: Psycho-education Group  Topic: Counselor led a session on Distress Tolerance.  She led an exercise to demonstrate distress tolerance, asking group members hold their arms in front of them for about 2 minutes and then having them share how they coped with the discomfort.  She reviewed distraction strategies (represented by the acronym ACCEPTS) and asked group members to provide examples of these strategies.  She also explained the concept of self-soothing and had group members brainstorm self-soothing methods for each of the five senses.  Counselor emphasized that these strategies were especially helpful for managing cravings in early recovery.   Summary: Patient's sobriety date remains 11/28.  He reported that he had dinner with his mother the other day and had enjoyed spending time with her.  He also shared that he was continuing to attend the 10:00 AA meeting daily.  During the psychoeducation group, he shared some of his distraction and self-soothing techniques.  He reported that calling others and comparing himself now to himself during active addiction were effective ways for him to deal with cravings.   Patient continues to do well in recovery and shows willingness to be open in group.   Family Program: Family present? No   Name of family member(s): n/a  UDS collected: No Results: n/a  AA/NA attended?: YesTuesday and Wednesday  Sponsor?: Yes   [facility]

## 2013-10-17 ENCOUNTER — Encounter (HOSPITAL_COMMUNITY): Payer: Self-pay | Admitting: Psychology

## 2013-10-17 ENCOUNTER — Other Ambulatory Visit (HOSPITAL_COMMUNITY): Payer: Self-pay

## 2013-10-17 NOTE — Progress Notes (Unsigned)
Patient ID: Kenneth PigeonSteven Good, male   DOB: [birth year], 48 y.o.   MRN: [id number]  PHONE CALL  PT CONTACTED AT PRIVATE PHONE/VOICE MAIL TO CHECK ON STATUS S/P CANCELLATION OF THE CD IOP GROUP TODAY.APOLOGY EXTENDED WITH PROMISE IT WILL NOT HAPPEN AGAIN AND INVITATION TO CALL BACK AS HE NEEDS/WANTS TO

## 2013-10-20 ENCOUNTER — Other Ambulatory Visit (HOSPITAL_COMMUNITY): Payer: Self-pay

## 2013-10-20 ENCOUNTER — Other Ambulatory Visit (HOSPITAL_COMMUNITY): Payer: No Typology Code available for payment source | Admitting: Psychology

## 2013-10-20 DIAGNOSIS — F1020 Alcohol dependence, uncomplicated: Secondary | ICD-10-CM

## 2013-10-22 ENCOUNTER — Other Ambulatory Visit (HOSPITAL_COMMUNITY): Payer: Self-pay

## 2013-10-22 ENCOUNTER — Encounter (HOSPITAL_COMMUNITY): Payer: Self-pay | Admitting: Psychology

## 2013-10-22 ENCOUNTER — Other Ambulatory Visit (HOSPITAL_COMMUNITY): Payer: No Typology Code available for payment source | Admitting: Psychology

## 2013-10-22 DIAGNOSIS — F1020 Alcohol dependence, uncomplicated: Secondary | ICD-10-CM

## 2013-10-22 NOTE — Progress Notes (Signed)
    Daily Group Progress Note  Program: CD-IOP   Group Time: 1-2:30 pm  Participation Level: Active  Behavioral Response: Appropriate and Sharing  Type of Therapy: Psycho-education Group  Topic: Psycho-Ed: upon completing the check-in, the group was provided a psycho education presentation on the 4 major causes of relapse in early recovery. Each of the 4 most common causes were discussed at length with members giving examples of what they had experienced. As every member was over any physical cravings or withdrawal, this brought the focus to the remaining 3 conditions. The session proved informative with group members who had relapsed in the past identifying what had specifically led to their use. There was good feedback and sharing among members.   Group Time: 2:45-4 pm  Participation Level: Active  Behavioral Response: Sharing  Type of Therapy: Process Group  Topic:Group process: the second half of group was spent in process. Members shared about current issues and concerns in early recovery. One member read the 12 and 12 for today and felt it was very powerful and pertinent. Four members had remembered to bring pictures so those were passed around. Some were from 50+ years ago and were clearly treasured memories.    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 He explained that it was using another drug that had led him back to the alcohol. He described how he had quickly begun taking more pills than was prescribed and within a few weeks he was drinking again. The patient asked to read the 12&12 and other members found it very poignant. He made some good comments and responded well to this intervention. His sobriety date remains 11/28.    Family Program: Family present? No   Name of family member(s):   UDS collected: Yes Results:not back from lab yet  AA/NA attended?: [place], Friday and Saturday  Sponsor?: Yes   Jovanna Hodges, LCAS

## 2013-10-23 NOTE — Progress Notes (Unsigned)
    Daily Group Progress Note  Program: CD-IOP   Group Time: 1:00-2:30pm  Participation Level: Active  Behavioral Response: Appropriate  Type of Therapy: Process Group  Topic: The first half of group was spent in process.  Counselor opened the group with a check-in and then led 5 minutes of a relaxation breathing exercise including some calming music.  Counselor then led a review on the group expectations and beliefs, and group members discussed concerns and suggestions about these expectations. Much of the conversation centered around the therapeutic benefits of sharing feelings and attending to others while they share. Group members then shared about issues they experienced during early recovery, including dealing with family health issues, handling denial, and finding a recovery community that meets their needs.     Group Time: 2:45-4:00pm  Participation Level: Active  Behavioral Response: Appropriate  Type of Therapy: Psycho-education Group  Topic: Counselor led a discussion on the Serenity Prayer.   Group members spent time creating lists of things that they can and cannot change, and then shared their lists with the entire group.  Counselor emphasized that in recovery, addicts have the ability to change their own behaviors, attitudes, and outlook, and cannot change other people, the past, or unforeseen events.  Counselor asked clients to share one thing they could change that day in order to better support their recovery, and several group members shared ideas.   Summary: Patient's sobriety date remains 11/28.  During the process group, he offered feedback for several group members and encouraged others who were experiencing difficulties.  He shared about his experience with his previous relapse, highlighting the importance of avoiding all mind-rewarding drugs, including prescription drugs.  He also read the Daily Reflection about gratitude.  Patient continues to do well in his  recovery and is becoming more open with the group.   Family Program: Family present? No   Name of family member(s): n/a  UDS collected: No Results: n/a  AA/NA attended?: YesTuesday and Wednesday  Sponsor?: Yes   [facility]

## 2013-10-24 ENCOUNTER — Encounter (HOSPITAL_COMMUNITY): Payer: Self-pay

## 2013-10-24 ENCOUNTER — Other Ambulatory Visit (HOSPITAL_COMMUNITY): Payer: No Typology Code available for payment source | Admitting: Psychology

## 2013-10-24 ENCOUNTER — Other Ambulatory Visit (HOSPITAL_COMMUNITY): Payer: Self-pay

## 2013-10-24 DIAGNOSIS — F1020 Alcohol dependence, uncomplicated: Secondary | ICD-10-CM

## 2013-10-27 ENCOUNTER — Encounter (HOSPITAL_COMMUNITY): Payer: Self-pay | Admitting: Psychology

## 2013-10-27 ENCOUNTER — Other Ambulatory Visit (HOSPITAL_COMMUNITY): Payer: No Typology Code available for payment source

## 2013-10-27 ENCOUNTER — Other Ambulatory Visit (HOSPITAL_COMMUNITY): Payer: Self-pay

## 2013-10-27 NOTE — Progress Notes (Signed)
    Daily Group Progress Note  Program: CD-IOP   Group Time: 1-2:30 pm  Participation Level: Active  Behavioral Response: Appropriate and Sharing  Type of Therapy: Process Group  Topic: Group Process; the first part of group was spent in process. After check-in, a member made a heartfelt and painful confession that he had lied to the group during the last session. He had relapsed, but been so embarrassed and ashamed that he couldn't admit it on Wednesday. The group provided positive and validating feedback and emphasized the good news, which was that he had "come back". A new member was also present and he introduced himself. He had also relapsed after 10+ years of sobriety and he received a warm welcome from his new group members. During this session the program director was meeting with new group members and current ones regarding medications and any issues or concerns about their prescriptions.   Group Time: 2:45- 4pm  Participation Level: Active  Behavioral Response: Sharing  Type of Therapy: Psycho-education Group  Topic: Psycho Ed; the second part of group was spent in an educational presentation. The group was asked to identify the pros and cons of sobriety versus active addiction. Members were quick to identify the pros or positives of active addiction and the cons. They were able to easily identify the pros of sobriety, but had a tough time identifying the cons of sobriety. There was good interaction and feedback among members and it proved insightful and provided a bonding or experience of connection among the group.   Summary: The patient checked in and reported that today he had picked up his 120 day chip! The group applauded this news and he was very pleased with having attained this length of sobriety. The patient reminded another member that all of them learn from mistakes and it is not failure, but a chance to learn something very important. The patient made some good  comments and displayed good insight into recovery. In the psycho-ed, the patient pointed out the negatives of using, including the cost of drinking in terms of dollars, but more so, the loss of self. He reported the ability and intention to be accountable is something he is really enjoying. The patient continues to make excellent progress and his sobriety date remains 11/28.   Family Program: Family present? No   Name of family member(s):   UDS collected: No Results  AA/NA attended?: YesWednesday, Thursday and Friday  Sponsor?: Yes   Myrla Malanowski, LCAS

## 2013-10-29 ENCOUNTER — Other Ambulatory Visit (HOSPITAL_COMMUNITY): Payer: No Typology Code available for payment source | Attending: Psychiatry

## 2013-10-29 ENCOUNTER — Other Ambulatory Visit (HOSPITAL_COMMUNITY): Payer: Self-pay

## 2013-10-29 DIAGNOSIS — F1020 Alcohol dependence, uncomplicated: Secondary | ICD-10-CM | POA: Insufficient documentation

## 2013-10-31 ENCOUNTER — Other Ambulatory Visit (HOSPITAL_COMMUNITY): Payer: Self-pay

## 2013-11-03 ENCOUNTER — Other Ambulatory Visit (HOSPITAL_COMMUNITY): Payer: Self-pay

## 2013-11-03 ENCOUNTER — Other Ambulatory Visit (HOSPITAL_COMMUNITY): Payer: No Typology Code available for payment source | Admitting: Psychology

## 2013-11-04 ENCOUNTER — Encounter (HOSPITAL_COMMUNITY): Payer: Self-pay | Admitting: Psychology

## 2013-11-04 NOTE — Progress Notes (Signed)
    Daily Group Progress Note  Program: CD-IOP   Group Time: 1-2:30 pm  Participation Level: Active  Behavioral Response: Appropriate and Sharing  Type of Therapy: Process Group  Topic: Group Process: the first part of group was spent in process. The group was much smaller with a number of members still out from the holiday weekend. Members shared about the past weekend and any concerns or issues that may have appeared. At my urging, one member disclosed and inappropriate relationship with a fellow group members. It had only begun early last week and consisted of talking and texting. Despite just a few days, it accelerated quickly and spun out of control, when the member's husband found the text messages. The patient explained his part of this brief disaster and expressed his remorse and apologized to his fellow group members. It was an intense session with members very attentive, but non-judgmental of their fellow group members. The member was applauded for his disclosures and a good discussion ensued about handling 'addictive' thoughts and its accompanying mentality.  Group Time: 2:45- 4pm  Participation Level: Active  Behavioral Response: Sharing  Type of Therapy: Psycho-education Group  Topic: Taking a Daily Inventory. The second half of group was spent in a psycho-ed. Members were provided a handout with a list of personality characteristics common of active addiction and self-will run wild. This list of characteristics was contrasted with a list of those more typical of sobriety and characteristics of one's Higher Power. Members discussed the characteristics and identified those most common when they are drinking or drugging. This proved particularly fitting considering the first part of group spent discussing self-will gone wild. The session was very open and members very honest about themselves. Emphasized in this session was the importance of identifying or picking up on this type  of thinking and, most important, talking to someone and addressing them when they first appear.    Summary: This patient was the man who had been texting/sexting another group member. Although she had begun the texts, he had engaged, followed along and just gotten deeper into this muck. He described what had happened and was very apologetic to his fellow group members. He shared that he had told his sponsor and was instructed to start back on Step 4. The patient received good feedback from the group and there was good insight and sharing among the members. In the second half of group, the patient reported he had to beware of 'ego trips' because these were characteristics of his addictive mentality. The patient made some good comments and humbled himself in group today and shared with the group the mistakes he made and what he should have done before things got out of hand. His sobriety date is 11/28.    Family Program: Family present? No   Name of family member(s):   UDS collected: No Results:   AA/NA attended?: YesMonday, Friday and Saturday  Sponsor?: Yes   Benicia Bergevin, LCAS

## 2013-11-05 ENCOUNTER — Other Ambulatory Visit (HOSPITAL_COMMUNITY): Payer: No Typology Code available for payment source

## 2013-11-05 ENCOUNTER — Other Ambulatory Visit (HOSPITAL_COMMUNITY): Payer: Self-pay

## 2013-11-07 ENCOUNTER — Other Ambulatory Visit (HOSPITAL_COMMUNITY): Payer: No Typology Code available for payment source | Admitting: Psychology

## 2013-11-07 ENCOUNTER — Other Ambulatory Visit (HOSPITAL_COMMUNITY): Payer: Self-pay

## 2013-11-07 DIAGNOSIS — F1020 Alcohol dependence, uncomplicated: Secondary | ICD-10-CM

## 2013-11-09 ENCOUNTER — Encounter (HOSPITAL_COMMUNITY): Payer: Self-pay | Admitting: Psychology

## 2013-11-09 NOTE — Progress Notes (Signed)
    Daily Group Progress Note  Program: CD-IOP   Group Time: 1-2:30 pm  Participation Level: Active  Behavioral Response: Appropriate and Sharing  Type of Therapy: Psycho-education Group  Topic: Psycho-Ed: after completing the check-in, the first part of group was spent in a psycho-ed that included a review of PAWS and a review of the relapse process. A new group member was present and he was open about his struggles. He had only been sober for less than 2 weeks and he received excellent feedback and support from his fellow group members. The session proved informative for new members and a good review for current members. During this time, the program director, CK, met with 2 new group members and members discharging or wanting to discuss medications.  Group Time: 2:45- 4pm  Participation Level: Minimal  Behavioral Response: Sharing  Type of Therapy: Process Group  Topic: Process/Graduation: the second half of group included process, where members shared about current issues and concerns. New struggles or challenges were also shared by the newer group members. As the session came to an end, a graduation ceremony was held to honor a member who was moving on after successfully completing the program. There were kind words of thanks and hope shared by all and it proved to be a touching ending to the session and the week.   Summary: The patient reported he was doing well and had attended a meeting this morning and would go to his home group after this session. He welcomed the new group members and provided them with the most updated phone list of other group members. 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 The patient offered good support and validation to the graduating member and made some good comments. The patient continues to make good progress and his sobriety date remains 11/28.    Family Program: Family present? No   Name of family member(s):   UDS collected: No Results:   AA/NA attended?: YesThursday and Friday  Sponsor?: Yes   Caileigh Canche, LCAS

## 2013-11-10 ENCOUNTER — Encounter (HOSPITAL_COMMUNITY): Payer: Self-pay | Admitting: Psychology

## 2013-11-10 ENCOUNTER — Other Ambulatory Visit (HOSPITAL_COMMUNITY): Payer: No Typology Code available for payment source | Admitting: Psychology

## 2013-11-10 ENCOUNTER — Other Ambulatory Visit (HOSPITAL_COMMUNITY): Payer: Self-pay

## 2013-11-10 DIAGNOSIS — F1020 Alcohol dependence, uncomplicated: Secondary | ICD-10-CM

## 2013-11-11 ENCOUNTER — Encounter (HOSPITAL_COMMUNITY): Payer: Self-pay | Admitting: Psychology

## 2013-11-11 NOTE — Progress Notes (Signed)
    Daily Group Progress Note  Program: CD-IOP   Group Time: 1-2:30 pm  Participation Level: Active  Behavioral Response: Appropriate and Sharing  Type of Therapy: Process Group  Topic: Group Process: the first part of group was spent in process.  A new group member was present and he introduced himself. Members shared about the past weekend and any issues, concerns or challenges that had presented themselves. There was good feedback among members and support for the new group member. Drug tests were collected from a number of members.  Group Time: 2:45- 4pm  Participation Level: Active  Behavioral Response: Sharing  Type of Therapy: Psycho-education Group  Topic: Core Beliefs: The second half of group was spent in a psycho-ed. Members were provided a handout on Core Beliefs and asked to complete the questions. Members identified some of their core beliefs and they were extremely negative. They included: "I am not lovable, I am a loser, and I am unworthy". The development of core beliefs occurs in childhood and then we take them with us and they color how interaction and perceptions with the world as an adult. Members talked about things that had occurred in their youth and how those perceptions have affected them today. There were some very powerful and emotional disclosures among group members and the session provided them with good insight into these distorted core beliefs. The next session will provide members with the skills and tools to challenge these negative views.   Summary: The patient reported he had had a good weekend. He had offered to assist another person in recovery and mowed and cleaned up her yard. A neighbor walked by and asked about him doing her yard. He made almost $200 and has more work Advertising account executivetomorrow. The patient admitted he had felt good about the physical effort and the beauty of his efforts and it had been a win-win. He provided good feedback to the new member and  shared a meeting schedule that he follows. The patient was able to identify with the core beliefs and negative self talk and admitted that his core belief was that "I am not good enough". we discussed his first and only marriage and how he had given the priority to his alcohol and not his wife and the children. The patient shared in a depth that has not been previous observed and he responded well to this intervention. His sobriety date remains 11/28.  Family Program: Family present? No   Name of family member(s):   UDS collected: No Results:   AA/NA attended?: YesFriday, Saturday and Sunday  Sponsor?: Yes   Latasha Buczkowski, LCAS

## 2013-11-12 ENCOUNTER — Other Ambulatory Visit (HOSPITAL_COMMUNITY): Payer: No Typology Code available for payment source | Admitting: Psychology

## 2013-11-12 DIAGNOSIS — F1020 Alcohol dependence, uncomplicated: Secondary | ICD-10-CM

## 2013-11-13 NOTE — Progress Notes (Unsigned)
    Daily Group Progress Note  Program: CD-IOP   Group Time: 1:00-2:30pm  Participation Level: Active  Behavioral Response: Appropriate  Type of Therapy: Process Group  Topic: The first half of group was spent in process.  Group members checked in by sharing their names and sobriety dates and then counselor led a 15-minute guided relaxation exercise.  Group members then shared about how they were doing in early recovery, and discussed issues such as PAWS symptoms, managing cravings, and identifying triggers.  A former group member stopped by and shared briefly about her experience with recovery.     Group Time: 2:45-4:00pm  Participation Level: Active  Behavioral Response: Appropriate  Type of Therapy: Psycho-education Group  Topic: Counselor continued the discussion on core beliefs from the previous group session.  She presented the concept of automatic thoughts and explained how they can be used to better understand core beliefs.  Group members provided examples of automatic thoughts and discussed how they reflected their core beliefs about themselves, others, and the world.  They also discussed how they could use alternate thoughts to challenge their automatic thoughts when they arise.   Summary: Patient's sobriety date remains 11/28.  He reported that he had worked yesterday and had attended meetings yesterday and today.  He provided feedback for several group members who shared during the process session.  During the psychoeducation session, he shared about how core beliefs about others had impacted him and his family of origin.   Family Program: Family present? No   Name of family member(s): n/a  UDS collected: No Results: n/a  AA/NA attended?: YesTuesday and Wednesday  Sponsor?: Yes   [facility]

## 2013-11-14 ENCOUNTER — Other Ambulatory Visit (HOSPITAL_COMMUNITY): Payer: No Typology Code available for payment source | Admitting: Psychology

## 2013-11-14 DIAGNOSIS — F1020 Alcohol dependence, uncomplicated: Secondary | ICD-10-CM

## 2013-11-16 ENCOUNTER — Encounter (HOSPITAL_COMMUNITY): Payer: Self-pay | Admitting: Psychology

## 2013-11-16 NOTE — Progress Notes (Signed)
    Daily Group Progress Note  Program: CD-IOP   Group Time: 1-2:30 pm  Participation Level: Active  Behavioral Response: Appropriate and Sharing  Type of Therapy: Process Group  Topic: Group Process: the first part of group was spent in process. Members shared about current issues and concerns. The newer group members asked questions of the members with longer sobriety. During the session today, new members were meeting with the Investment banker, operational. In addition, he also met with current members about medication questions/changes as well as a discharge. There was good discussion and disclosure among the members.  Group Time: 2:45- 4pm  Participation Level: Active  Behavioral Response: Appropriate and Sharing  Type of Therapy: Psycho-education Group  Topic: Addiction and Dopamine/Graduation: the second half of group was spent watching a slide show on addiction and the chemical changes that occur in the brain. The group found the pet scan slides especially interesting. Even after 100 days of sobriety, the dopamine in the subject's brain remained very depleted. As the session neared the end, a graduation ceremony was held for a member who was successfully completing the program and her treatment today. There were kind words of hope and expressions of gratitude and it proved to be a very touching ceremony.   Summary: The patient reported he had attended a meeting everyday this week. He provided good feedback to the member asking about AA. The patient encouraged him to continue to go to [facility] meetings and one day he will hear someone describe their own story, but it will also match his story. He will recognize that there are many people that struggle with this disease and he is not alone. The patient was fascinated by the slide show and the pet scans showing obvious dopamine depletion even after 100 days of sobriety. He agreed that this view enhanced his understanding of why one feels so badly for so  long in early recovery. The patient shared words of hope and encouragement with the graduating member and expressed his gratitude for her kindness and support of his own recovery. He continues to do incredibly well and his sobriety date is 11/28.    Family Program: Family present? No   Name of family member(s):   UDS collected: No Results:   AA/NA attended?: YesWednesday, Thursday and Friday  Sponsor?: Yes   Normalee Sistare, LCAS

## 2013-11-17 ENCOUNTER — Other Ambulatory Visit (HOSPITAL_COMMUNITY): Payer: No Typology Code available for payment source | Admitting: Psychology

## 2013-11-17 DIAGNOSIS — F1020 Alcohol dependence, uncomplicated: Secondary | ICD-10-CM

## 2013-11-18 ENCOUNTER — Encounter (HOSPITAL_COMMUNITY): Payer: Self-pay | Admitting: Psychology
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
## 2013-11-19 ENCOUNTER — Other Ambulatory Visit (HOSPITAL_COMMUNITY): Payer: No Typology Code available for payment source | Admitting: Psychology

## 2013-11-19 DIAGNOSIS — F1020 Alcohol dependence, uncomplicated: Secondary | ICD-10-CM

## 2013-11-20 NOTE — Progress Notes (Unsigned)
    Daily Group Progress Note  Program: CD-IOP   Group Time: 1:00-2:30pm  Participation Level: Active  Behavioral Response: Appropriate  Type of Therapy: Process Group  Topic: The first half of group was spent with a group speaker, who shared his recovery story with the group.  He explained the effects that drug use had on his life for many years, and then described his process of recovery and the aspects of recovery that were most important to him.  The counselor also encouraged him to share about the importance of sponsorship, which he did. The group was very appreciative of his story and many group members reported being very inspired by him.     Group Time: 2:45-4:00pm  Participation Level: Active  Behavioral Response: Appropriate  Type of Therapy: Psycho-education Group  Topic: The second half of group was spent discussing the story shared by the guest speaker.  Group members shared their reactions to his story and spent time discussing the reasons that having a sponsor was important to them.  They shared about the various requirements their sponsors have for them and discussed the process of choosing a sponsor.  The final half hour of group was spent in process, and several group members shared about how they were doing in their recovery.   Summary: Patient's sobriety date remains 11/28.  He was very attentive and interested during the guest speaker's presentation.  He connected to the speaker's story by sharing about hiding his own drinking from his friends, who he did not realize knew he was drinking heavily.  He shared about his relationship with his sponsor and the accountability that his sponsor provides for him.     Family Program: Family present? No   Name of family member(s): n/a  UDS collected: No Results: n/a  AA/NA attended?: YesTuesday and Wednesday  Sponsor?: Yes   [facility]

## 2013-11-21 ENCOUNTER — Other Ambulatory Visit (HOSPITAL_COMMUNITY): Payer: No Typology Code available for payment source | Admitting: Psychology

## 2013-11-21 DIAGNOSIS — F10220 Alcohol dependence with intoxication, uncomplicated: Secondary | ICD-10-CM

## 2013-11-23 ENCOUNTER — Encounter (HOSPITAL_COMMUNITY): Payer: Self-pay | Admitting: Psychology

## 2013-11-23 NOTE — Progress Notes (Signed)
    Daily Group Progress Note  Program: CD-IOP   Group Time: 1-2:30 pm  Participation Level: None, he was not here  Behavioral Response: Not present in this part of group  Type of Therapy: Process Group  Topic: Process/Guided Relaxation: the first part of group was spent in process. Members shared about current issues and concerns. Two new group members were present and they introduced themselves during this part of group. They received good feedback and support from their new group members.  Group Time: 2:45- 4pm  Participation Level: Active  Behavioral Response: Appropriate and Sharing  Type of Therapy: Psycho-education Group  Topic: Wheel of Life: the second half of group was spent in a psycho-ed using the Wheel of Life handout. Group members were provided with a handout with a circle in the center. The circle included 8 segments, each of which represented different important elements of life.  The handout required members to identify how satisfied or fulfilled they are in each category. The exercise allows the participant to see how his/her wheel looks relative to balance as well as challenging the person to describe their wheel to their fellow group members. The session ended with a graduation ceremony and [facility] the counseling intern who has been here in the CD-IOP since August. It was an emotional, but kindhearted farewell.   Summary: The patient arrived late to group today. He had been scheduled for an MRI and subsequent X-ray over concerns about recent pain in his mouth and any damage to the surgical procedures he has undergone in the past few months. He was pleased to report that there was no structural damage and he was fine. His physician reminded him that with all of the metal in his mouth, he will be sore for the rest of his life - particularly during times of significant temperature change or when he is particularly active. He was very pleased to provide this report  to his fellow group members. The patient made some excellent comments to his fellow members and provided good feedback to the new members. He continues to make excellent progress in his recovery and his sobriety date remains 11/28.    Family Program: Family present? No   Name of family member(s):   UDS collected: No Results:  AA/NA attended?: YesWednesday and Thursday  Sponsor?: Yes   Kaelea Gathright, LCAS

## 2013-11-24 ENCOUNTER — Other Ambulatory Visit (HOSPITAL_COMMUNITY): Payer: No Typology Code available for payment source | Admitting: Psychology

## 2013-11-24 DIAGNOSIS — F10220 Alcohol dependence with intoxication, uncomplicated: Secondary | ICD-10-CM

## 2013-11-25 ENCOUNTER — Encounter (HOSPITAL_COMMUNITY): Payer: Self-pay | Admitting: Psychology

## 2013-11-25 NOTE — Progress Notes (Signed)
    Daily Group Progress Note  Program: CD-IOP   Group Time: 1-2:30 pm  Participation Level: Active  Behavioral Response: Appropriate and Sharing  Type of Therapy: Process Group  Topic: Group Process: the first part of group was spent in process. Members shared about the past weekend and any concerns or obstacles to their ongoing sobriety. On member admitted she had taken 2 pills last Thursday evening. A friend had been over and provided them to her because she was so stressed out and anxious. This relapse was discussed and the seriousness of taking other people's medications reiterated. There was good disclosure and feedback among group members.  Group Time: 2:45- 4pm  Participation Level: Active  Behavioral Response: Sharing  Type of Therapy: Psycho-education Group  Topic: Relapse: the Four Most common Conditions. The second part of group was spent in a psycho-ed on relapse. A handout was provided with the four most common conditions or circumstances that lead to relapse. Members were asked to shared examples of all four of these conditions. Almost everyone had experienced each of the conditions in some way. Identifying strategies and making plans to address these conditions was discussed at length. The session was lively with good feedback among members.   Summary:The patient reported he had had a great weekend. He had attended meetings, gone to a party, and didn't get home until almost 2 am. He was sober and it had been great!. He extolled the benefits and gifts of recovery and provided good encouraged to the newer members of the group. He shared how the man that had helped him find his first AA meeting had noted that he looks and acts completely different - it was the ultimate compliment! The patient was encouraging and supportive and offered some kind words to the graduating member. He continues to make good progress and his sobriety date is 11/28.      Family Program: Family  present? No   Name of family member(s):   UDS collected: No Results:   AA/NA attended?: YesMonday, Friday, Saturday and Sunday  Sponsor?: Yes   Kenneth Good, LCAS

## 2013-11-26 ENCOUNTER — Other Ambulatory Visit (HOSPITAL_COMMUNITY): Payer: No Typology Code available for payment source

## 2013-11-28 ENCOUNTER — Other Ambulatory Visit (HOSPITAL_COMMUNITY): Payer: No Typology Code available for payment source | Attending: Psychiatry | Admitting: Psychology

## 2013-11-28 DIAGNOSIS — F1020 Alcohol dependence, uncomplicated: Secondary | ICD-10-CM | POA: Insufficient documentation

## 2013-12-01 ENCOUNTER — Other Ambulatory Visit (HOSPITAL_COMMUNITY): Payer: No Typology Code available for payment source | Admitting: Psychology

## 2013-12-01 ENCOUNTER — Encounter (HOSPITAL_COMMUNITY): Payer: Self-pay | Admitting: Psychology

## 2013-12-01 DIAGNOSIS — F1020 Alcohol dependence, uncomplicated: Secondary | ICD-10-CM

## 2013-12-01 NOTE — Progress Notes (Signed)
    Daily Group Progress Note  Program: CD-IOP   Group Time: 1-2:30 pm  Participation Level: Active  Behavioral Response: Appropriate and Sharing  Type of Therapy: Process Group  Topic: Group process. The first part of group was spent in process. Members shared about current issues and concerns. A new member 'introduced' herself and gave the group a little about her history and what she needs now. Another member shared about his concerns about the upcoming weekend. Another member arrived late and disclosed very personal things about the night before and, indirectly, her childhood. The session was very personal with members becoming very vulnerable through their sharing.   Group Time: 2:45-4pm  Participation Level: Active  Behavioral Response: Sharing  Type of Therapy: Psycho-education Group  Topic: Psycho-Ed: the second half of group was spent in an educational piece on "12-Step Slogans". A handout was provided to group members identifying over 50 AA slogans. The group read over the handout and discussed each slogan. Individual members were chosen and they explained, to the best of their ability, what the slogan meant. The session was informative and funny with members sharing their interpretations, many of which were not very accurate. As the session came to an end, members observed and celebrated a graduation. There were kind and hopeful words shared as the members said good-bye.   Summary: The patient reported things were going well. He was very complimentary and validating to the member who had disclosed some of his 'story' in the AA meeting at 10 am today. He told the member he was very 'proud' of him. The patient displayed a very good understanding of the AA slogans and provided good feedback. He shared very heartfelt words to the graduating member and he continues to make excellent progress in his recovery. The patient's sobriety date remains 06/27/13.  Family Program: Family  present? No   Name of family member(s):   UDS collected: No Results:  AA/NA attended?: YesWednesday, Thursday and Friday  Sponsor?: Yes   Shalae Belmonte, LCAS

## 2013-12-03 ENCOUNTER — Other Ambulatory Visit (HOSPITAL_COMMUNITY): Payer: Self-pay

## 2013-12-05 ENCOUNTER — Other Ambulatory Visit (HOSPITAL_COMMUNITY): Payer: Self-pay

## 2013-12-08 ENCOUNTER — Other Ambulatory Visit (HOSPITAL_COMMUNITY): Payer: No Typology Code available for payment source

## 2013-12-10 ENCOUNTER — Encounter (HOSPITAL_COMMUNITY): Payer: Self-pay | Admitting: Psychology

## 2013-12-10 ENCOUNTER — Other Ambulatory Visit (HOSPITAL_COMMUNITY): Payer: No Typology Code available for payment source | Admitting: Psychology

## 2013-12-10 DIAGNOSIS — F1020 Alcohol dependence, uncomplicated: Secondary | ICD-10-CM

## 2013-12-10 NOTE — Progress Notes (Signed)
    Daily Group Progress Note  Program: CD-IOP   Group Time: 1-2:30 pm  Participation Level: Active  Behavioral Response: Appropriate and Sharing  Type of Therapy: Process Group  Topic: Group Process: the first part off group was spent in process. Members shared about issues, concerns and events over this past weekend. One member described a successful weekend topped with his son telling him he was proud of him. Another member had a call to ED for her 47 yo mother. Everyone attended at least one 12-step meeting with some attending 3-4 meetings. There was good disclosure among members and good validation on recovery-related behaviors. Drug tests were collected from every member present.   Group Time: 2:45- 4pm  Participation Level: Active  Behavioral Response: Appropriate  Type of Therapy: Psycho-education Group  Topic: "Dry Drunk": the second half of group was spent in a psycho-ed describing the term "dry drunk". A 2-page handout was provided and members took turns to read the descriptions. The session was effective in identifying the characteristics that occur when one goes from a good recovery program gradually into relapse mentality and, subsequent, relapse. There was a good discussion with those who had relapsed in the past providing examples of how their mentality changed. At the conclusion of the session, a graduation ceremony was held for a member who was successfully leaving the program.   Summary: The patient reported he was doing well. He had had a great weekend. He had attended meetings, gone to a birthday picnic at the park, and played golf with 2 other group members and his sponsor. The patient provided good feedback to his fellow group members and was very open about his previous relapses. He emphasized that relapse is not a spontaneous event, but rather a process. The patient provided good support to his fellow group members and he was very kind to his fellow group member  who was graduating. He assured him that they would remain friends. The patient's sobriety date remains 11/28.   Family Program: Family present? No   Name of family member(s):   UDS collected: No Results:   AA/NA attended?: YesMonday, Friday, Saturday and Sunday  Sponsor?: Yes   Meleny Tregoning, LCAS

## 2013-12-11 ENCOUNTER — Encounter (HOSPITAL_COMMUNITY): Payer: Self-pay | Admitting: Psychology

## 2013-12-11 NOTE — Progress Notes (Signed)
    Daily Group Progress Note  Program: CD-IOP   Group Time: 1-2:30 pm  Participation Level: Active  Behavioral Response: Appropriate and Sharing  Type of Therapy: Process Group  Topic: Group Process: The first part of group was spent in process. Members shared about their week and any struggles or challenges or successes in early recovery. Another member shared that a distant in-law is struggling with alcoholism. He was considering going to his home and talking to him about getting sober. This disclosure invited good feedback with members expressing their concerns for the group member and worries that he may be discouraged or take any rejection personally.  A new group member was present and she introduced herself and shared a little about her history. The group was very receptive and welcoming to her.   Group Time: 2:45- 4pm  Participation Level: Active  Behavioral Response: Sharing  Type of Therapy: Psycho-education Group  Topic: Family Sculpture/Graduation: The second half of group was spent in a psycho-ed about family dynamics. Members were asked to sculpt their families using other group members to represent each of their family members. The session proved very informative with patients sharing more details about their early lives to their fellow group members. As the session drew to a close, a graduation was held honoring a member who was successfully completing the program today. There were tears and fond farewells as this well-respected member prepares to leave the program. The graduating member shared the importance of taking recovery seriously and admitted had he not come to treatment, he felt certain he would either be dead or on his way to death. It was a touching end to a good session.   Summary:The patient reported he was doing well. He had attended at least 3 meetings since the last group session and is looking forward to his graduation today. He provided good feedback and  warned other members to be very cautious when it comes to others that continue to drink or drug. He offered his family sculpture and the group provided good comments and pointed out certain characteristics that he carried on into adulthood. The graduation proved very touching with members sharing kind thoughts of hope and gratitude. This patient has been in the program for quite a long time - choosing to remain here and do the work he knew must be done. The patient expressed his gratitude for this gift of treatment and provided encouragement and direction for the members he is leaving behind. His sobriety date remains 11/28.   Family Program: Family present? No   Name of family member(s):   UDS collected: No Results:   AA/NA attended?: YesMonday, Tuesday and Wednesday  Sponsor?: Yes   Jonny Dearden, LCAS

## 2013-12-12 ENCOUNTER — Other Ambulatory Visit (HOSPITAL_COMMUNITY): Payer: Self-pay

## 2014-02-08 ENCOUNTER — Encounter (HOSPITAL_COMMUNITY): Payer: Self-pay | Admitting: Emergency Medicine

## 2014-02-08 ENCOUNTER — Emergency Department (HOSPITAL_COMMUNITY)
Admission: EM | Admit: 2014-02-08 | Discharge: 2014-02-08 | Disposition: A | Discharge status: Home / Self Care | Payer: Self-pay | Attending: Emergency Medicine | Admitting: Emergency Medicine

## 2014-02-08 ENCOUNTER — Emergency Department (HOSPITAL_COMMUNITY): Payer: Self-pay

## 2014-02-08 DIAGNOSIS — R0602 Shortness of breath: Secondary | ICD-10-CM | POA: Insufficient documentation

## 2014-02-08 DIAGNOSIS — R110 Nausea: Secondary | ICD-10-CM | POA: Insufficient documentation

## 2014-02-08 DIAGNOSIS — R209 Unspecified disturbances of skin sensation: Secondary | ICD-10-CM | POA: Insufficient documentation

## 2014-02-08 DIAGNOSIS — F411 Generalized anxiety disorder: Secondary | ICD-10-CM | POA: Insufficient documentation

## 2014-02-08 DIAGNOSIS — R079 Chest pain, unspecified: Secondary | ICD-10-CM | POA: Insufficient documentation

## 2014-02-08 DIAGNOSIS — R42 Dizziness and giddiness: Secondary | ICD-10-CM | POA: Insufficient documentation

## 2014-02-08 HISTORY — DX: Anxiety disorder, unspecified: F41.9

## 2014-02-08 LAB — TROPONIN I: Troponin I: <0.3 ng/mL (ref <0.30)

## 2014-02-08 LAB — ETHANOL

## 2014-02-08 LAB — CBC WITH DIFFERENTIAL/PLATELET
BASOS PCT: 0 % (ref 0–1)
Basophils Absolute: 0 10*3/uL (ref 0.0–0.1)
Eosinophils Absolute: 0 10*3/uL (ref 0.0–0.7)
Eosinophils Relative: 0 % (ref 0–5)
HCT: 42.8 % (ref 39.0–52.0)
HEMOGLOBIN: 14.8 g/dL (ref 13.0–17.0)
LYMPHS ABS: 1 10*3/uL (ref 0.7–4.0)
Lymphocytes Relative: 18 % (ref 12–46)
MCH: 31.6 pg (ref 26.0–34.0)
MCHC: 34.6 g/dL (ref 30.0–36.0)
MCV: 91.3 fL (ref 78.0–100.0)
MONOS PCT: 14 % — AB (ref 3–12)
Monocytes Absolute: 0.8 10*3/uL (ref 0.1–1.0)
NEUTROS ABS: 3.7 10*3/uL (ref 1.7–7.7)
NEUTROS PCT: 68 % (ref 43–77)
Platelets: 120 10*3/uL — ABNORMAL LOW (ref 150–400)
RBC: 4.69 MIL/uL (ref 4.22–5.81)
RDW: 13.9 % (ref 11.5–15.5)
WBC: 5.6 10*3/uL (ref 4.0–10.5)

## 2014-02-08 LAB — COMPREHENSIVE METABOLIC PANEL
ALBUMIN: 4.3 g/dL (ref 3.5–5.2)
ALK PHOS: 99 U/L (ref 39–117)
ALT: 50 U/L (ref 0–53)
ANION GAP: 20 — AB (ref 5–15)
AST: 87 U/L — ABNORMAL HIGH (ref 0–37)
BILIRUBIN TOTAL: 0.9 mg/dL (ref 0.3–1.2)
BUN: 9 mg/dL (ref 6–23)
CHLORIDE: 93 meq/L — AB (ref 96–112)
CO2: 24 mEq/L (ref 19–32)
Calcium: 8.9 mg/dL (ref 8.4–10.5)
Creatinine, Ser: 0.79 mg/dL (ref 0.50–1.35)
GFR calc Af Amer: >90 mL/min (ref >90)
GFR calc non Af Amer: >90 mL/min (ref >90)
GLUCOSE: 89 mg/dL (ref 70–99)
POTASSIUM: 4 meq/L (ref 3.7–5.3)
Sodium: 137 mEq/L (ref 137–147)
Total Protein: 8 g/dL (ref 6.0–8.3)

## 2014-02-08 MED ORDER — LORAZEPAM 1 MG PO TABS
1.0000 mg | ORAL_TABLET | Freq: Once | ORAL | Status: AC
  Administered 2014-02-08: 1 mg via ORAL
  Filled 2014-02-08: qty 1

## 2014-02-08 NOTE — ED Provider Notes (Signed)
CSN: [id number]     Arrival date & time 02/08/14  0038 History   First MD Initiated Contact with Patient 02/08/14 0458     Chief Complaint  Patient presents with  . Chest Pain      Patient is a 48 y.o. male presenting with chest pain. The history is provided by the patient.  Chest Pain Pain location:  L chest Pain quality: pressure   Pain radiates to:  Does not radiate Pain radiates to the back: no   Pain severity:  Moderate Onset quality:  Gradual Duration:  2 hours Timing:  Constant Progression:  Unchanged Relieved by:  Nothing Worsened by:  Nothing tried Associated symptoms: anxiety, dizziness, nausea, numbness and shortness of breath   Associated symptoms: no abdominal pain, no back pain, no fever, no syncope and no weakness   pt reports he began to have left sided CP about 2 hours prior to arrival.  It came on at rest He has never had this before.  He has no h/o cardiac disease. No h/o DVT/PE He reports associated SOB and nausea  He reports soon after the CP started he felt numbness in his toes on right foot and fingers on right hand.  No focal weakness.    He also reports he felt fatigued yesterday and "dizzy" during the day but no syncope reported   He reports h/o ETOH abuse but sober for 8 months Denies drug use   fam hx - negative for CAD  Past Medical History  Diagnosis Date  . Alcohol abuse   . Anxiety    Past Surgical History  Procedure Laterality Date  . Wisdom tooth extraction    . Laceration repair      arm  . Closed reduction mandible N/A 02/06/2013    Procedure: CLOSED REDUCTION MANDIBULAR FRACTURE;  Surgeon: Suzanna ObeyJohn Byers, MD;  [facility];  Service: ENT;  Laterality: N/A;  . Orif mandibular fracture N/A 02/06/2013    Procedure: MAXILLARY MANDIBULAR FIXATION;  Surgeon: Suzanna ObeyJohn Byers, MD;  [facility];  Service: ENT;  Laterality: N/A;  . Mandibular hardware removal N/A 03/27/2013    Procedure: MANDIBULAR HARDWARE REMOVAL AND ARCH BAR REMOVAL ;   Surgeon: Suzanna ObeyJohn Byers, MD;  Location: [facility];  Service: ENT;  Laterality: N/A;   Family History  Problem Relation Age of Onset  . Alcohol abuse Father   . Alcohol abuse Brother   . Alcohol abuse Paternal Grandfather    History  Substance Use Topics  . Smoking status: Never Smoker   . Smokeless tobacco: Not on file  . Alcohol Use: 42.0 oz/week    70 Cans of beer per week     Comment: quit drinking in March 2014    Review of Systems  Constitutional: Negative for fever.  Respiratory: Positive for shortness of breath.   Cardiovascular: Positive for chest pain. Negative for syncope.  Gastrointestinal: Positive for nausea. Negative for abdominal pain.  Musculoskeletal: Negative for back pain and neck pain.  Neurological: Positive for dizziness and numbness. Negative for syncope and weakness.  Psychiatric/Behavioral: The patient is nervous/anxious.   All other systems reviewed and are negative.     Allergies  Review of patient's allergies indicates no known allergies.  Home Medications   Prior to Admission medications   Not on File   BP 144/88  Pulse 100  Temp(Src) 98 F (36.7 C) (Oral)  Resp 16  SpO2 100% Physical Exam CONSTITUTIONAL: Well developed/well nourished, anxious HEAD: Normocephalic/atraumatic EYES: EOMI/PERRL,  no nystagmus ENMT: Mucous membranes moist NECK: supple no meningeal signs SPINE:entire spine nontender CV: S1/S2 noted, no murmurs/rubs/gallops noted LUNGS: Lungs are clear to auscultation bilaterally, no apparent distress ABDOMEN: soft, nontender, no rebound or guarding GU:no cva tenderness NEURO:Awake/alert, facies symmetric, no arm or leg drift is noted Equal 5/5 strength with shoulder abduction, elbow flex/extension, wrist flex/extension in upper extremities and equal hand grips bilaterally Equal 5/5 strength with hip flexion,knee flex/extension, foot dorsi/plantar flexion Cranial nerves 3/4/5/6/02/05/09/11/12 tested and  intact Gait normal without ataxia No past pointing He reports numbness to fingers of right hand and toes on right foot.  No other sensory deficit noted EXTREMITIES: pulses normal, full ROM SKIN: warm, color normal PSYCH: pt anxious   ED Course  Procedures   Low suspicion for PE/dissection He is well appearing but is extremely anxious HEART score less than 3.  Repeat troponin negative and EKG essentially unchanged from priors He reports numbness started after he became SOB and no focal weakness noted.  I doubt CVA.   Low suspicion for ACS at this time I advised need for f/u as outpatient We discussed return precautions Labs Review Labs Reviewed  CBC WITH DIFFERENTIAL - Abnormal; Notable for the following:    Platelets 120 (*)    Monocytes Relative 14 (*)    All other components within normal limits  COMPREHENSIVE METABOLIC PANEL - Abnormal; Notable for the following:    Chloride 93 (*)    AST 87 (*)    Anion gap 20 (*)    All other components within normal limits  TROPONIN I  TROPONIN I  ETHANOL    Imaging Review Dg Chest 2 View  02/08/2014   CLINICAL DATA:  Chest pain, shortness of breath, nausea for 2 hr.  EXAM: CHEST  2 VIEW  COMPARISON:  05/25/2011  FINDINGS: The heart size and mediastinal contours are within normal limits. Both lungs are clear. The visualized skeletal structures are unremarkable.  IMPRESSION: No active cardiopulmonary disease.   Electronically Signed   By: Burman Nieves M.D.   On: 02/08/2014 02:00     EKG Interpretation   Date/Time:  Sunday February 08 2014 05:39:33 EDT Ventricular Rate:  67 PR Interval:  133 QRS Duration: 84 QT Interval:  381 QTC Calculation: 402 R Axis:   20 Text Interpretation:  Sinus rhythm ST elev, probable normal early repol  pattern Confirmed by Bebe Shaggy  MD, Mohid Furuya ([id number]) on 02/08/2014 7:09:04 AM       MDM   Final diagnoses:  Chest pain, unspecified chest pain type    Nursing notes including past medical  history and social history reviewed and considered in documentation xrays reviewed and considered Labs/vital reviewed and considered     Joya Gaskins, MD 02/08/14 [phone]

## 2014-02-08 NOTE — ED Notes (Signed)
The pt arrived by gems from home.  C/o mid-chest pain with sob and nausea.  Hx of anxiety.  8 months sober.  Pt coming in jittery nervous.  Iv per ems they gave zofran 3641m iv and 4 baby aspirin

## 2014-02-08 NOTE — Discharge Instructions (Signed)

## 2014-02-08 NOTE — ED Notes (Signed)
For 2 hours he has had this chest pain . He felt bad yesterday am while cleaning the house.  He is also c/o dizziness and breathing fast and earlier had rt side numbness none now.  His nausea is better after getting the  zofran

## 2014-03-02 NOTE — Progress Notes (Signed)
    Daily Group Progress Note  Program: CD-IOP   Group Time: 1-2  Participation Level: Active  Behavioral Response: Appropriate and Sharing  Type of Therapy: Process Group  Topic: Process group: Pt had a lot to share as he had been out of group the last two sessions for a new job. He reported really enjoying starting back to work and told the group that while there were moments that he had some minor craving, he mostly handled the time well and feels good about graduating next session.      Group Time: 2-3  Participation Level: Minimal  Behavioral Response: Appropriate  Type of Therapy: Process Group  Topic: process group cont   Summary: The patient was active and appropriate in the second half of group. He did not have much to add but was supportive of another member who had a very tough weekend.    Family Program: Family present? NA   Name of family member(s):   UDS collected: No Results:   AA/NA attended?: YesSaturday and Sunday  Sponsor?: Yes   [facility]

## 2016-06-06 ENCOUNTER — Other Ambulatory Visit: Payer: Self-pay | Admitting: Emergency Medicine

## 2016-06-06 DIAGNOSIS — M2392 Unspecified internal derangement of left knee: Secondary | ICD-10-CM

## 2016-06-07 ENCOUNTER — Other Ambulatory Visit: Payer: Self-pay

## 2016-06-10 ENCOUNTER — Ambulatory Visit
Admission: RE | Admit: 2016-06-10 | Discharge: 2016-06-10 | Disposition: A | Discharge status: Home / Self Care | Payer: BLUE CROSS/BLUE SHIELD | Source: Ambulatory Visit | Attending: Emergency Medicine | Admitting: Emergency Medicine

## 2016-06-10 DIAGNOSIS — M2392 Unspecified internal derangement of left knee: Secondary | ICD-10-CM

## 2019-01-30 ENCOUNTER — Other Ambulatory Visit: Payer: Self-pay

## 2019-01-30 ENCOUNTER — Emergency Department (HOSPITAL_COMMUNITY)
Admission: EM | Admit: 2019-01-30 | Discharge: 2019-01-30 | Disposition: A | Discharge status: Home / Self Care | Payer: BC Managed Care – PPO | Attending: Emergency Medicine | Admitting: Emergency Medicine

## 2019-01-30 ENCOUNTER — Emergency Department (HOSPITAL_COMMUNITY): Payer: BC Managed Care – PPO

## 2019-01-30 ENCOUNTER — Encounter (HOSPITAL_COMMUNITY): Payer: Self-pay | Admitting: Emergency Medicine

## 2019-01-30 DIAGNOSIS — W109XXA Fall (on) (from) unspecified stairs and steps, initial encounter: Secondary | ICD-10-CM | POA: Diagnosis not present

## 2019-01-30 DIAGNOSIS — S4991XA Unspecified injury of right shoulder and upper arm, initial encounter: Secondary | ICD-10-CM | POA: Insufficient documentation

## 2019-01-30 DIAGNOSIS — Y939 Activity, unspecified: Secondary | ICD-10-CM | POA: Insufficient documentation

## 2019-01-30 DIAGNOSIS — Y929 Unspecified place or not applicable: Secondary | ICD-10-CM | POA: Diagnosis not present

## 2019-01-30 DIAGNOSIS — Y999 Unspecified external cause status: Secondary | ICD-10-CM | POA: Diagnosis not present

## 2019-01-30 MED ORDER — ACETAMINOPHEN 325 MG PO TABS
650.0000 mg | ORAL_TABLET | Freq: Once | ORAL | Status: AC
  Administered 2019-01-30: 18:00:00 650 mg via ORAL
  Filled 2019-01-30: qty 2

## 2019-01-30 NOTE — Discharge Instructions (Addendum)
1. Medications: alternate ibuprofen and tylenol for pain control, usual home medications 2. Treatment: rest, ice, elevate and use sling for comfort, drink plenty of fluids, gentle stretching 3. Follow Up: Please followup with orthopedics as directed or your PCP in 1 week if no improvement for discussion of your diagnoses and further evaluation after today's visit; if you do not have a primary care doctor use the resource guide provided to find one; Please return to the ER for worsening symptoms or other concerns.

## 2019-01-30 NOTE — ED Provider Notes (Signed)
[facility]-EMERGENCY DEPT Provider Note   CSN: [id number] Arrival date & time: 01/30/19  1637    History   Chief Complaint Chief Complaint  Patient presents with  . arm injury    HPI Elisabeth PigeonSteven Mccard is a 53 y.o. male with a PMH of alcohol abuse and anxiety presenting after a right upper arm injury onset at 5:30am today. Patient reports he heard a dog parking and went to see what happened, when he missed a step on the stairs and caught his arm against the wall and the rail. Patient describes pain as a constant ache and states it is worse with movement. Patient states he has taken Advil without relief. Patient reports edema and bruising around the right arm. Patient denies numbness, paresthesias, or weakness. Patient reports he is able to move arm, but it is uncomfortable. Patient denies previous injury in right arm. Patient denies fever, chills, cough, nausea, vomiting, abdominal pain, shortness of breath, or chest pain. Patient denies falling, head injury, or LOC. Patient states he is right hand dominant. Patient denies shoulder, elbow, neck, or wrist pain.     HPI  Past Medical History:  Diagnosis Date  . Alcohol abuse   . Anxiety     Patient Active Problem List   Diagnosis Date Noted  . Acne rosacea, papular type 09/05/2013  . Acne rosacea, papular type 09/05/2013  . Fracture of mandible, multiple sites (HCC) 08/29/2013  . Alcohol dependence (HCC) 07/15/2013    Past Surgical History:  Procedure Laterality Date  . CLOSED REDUCTION MANDIBLE N/A 02/06/2013   Procedure: CLOSED REDUCTION MANDIBULAR FRACTURE;  Surgeon: Suzanna ObeyJohn Byers, MD;  [facility];  Service: ENT;  Laterality: N/A;  . LACERATION REPAIR     arm  . MANDIBULAR HARDWARE REMOVAL N/A 03/27/2013   Procedure: MANDIBULAR HARDWARE REMOVAL AND ARCH BAR REMOVAL ;  Surgeon: Suzanna ObeyJohn Byers, MD;  Location: [facility];  Service: ENT;  Laterality: N/A;  . ORIF MANDIBULAR FRACTURE N/A 02/06/2013   Procedure: MAXILLARY MANDIBULAR FIXATION;  Surgeon: Suzanna ObeyJohn Byers, MD;  [facility];  Service: ENT;  Laterality: N/A;  . WISDOM TOOTH EXTRACTION          Home Medications    Prior to Admission medications   Not on File    Family History Family History  Problem Relation Age of Onset  . Alcohol abuse Father   . Alcohol abuse Brother   . Alcohol abuse Paternal Grandfather     Social History Social History   Tobacco Use  . Smoking status: Never Smoker  Substance Use Topics  . Alcohol use: Yes    Alcohol/week: 70.0 standard drinks    Types: 70 Cans of beer per week    Comment: quit drinking in March 2014  . Drug use: No     Allergies   Patient has no known allergies.   Review of Systems Review of Systems  Constitutional: Negative for activity change, chills, diaphoresis and fever.  Eyes: Negative for pain.  Respiratory: Negative for cough and shortness of breath.   Cardiovascular: Negative for chest pain.  Gastrointestinal: Negative for abdominal pain, nausea and vomiting.  Musculoskeletal: Positive for myalgias. Negative for arthralgias, back pain, gait problem, joint swelling, neck pain and neck stiffness.       Pt reports right upper arm pain.   Skin: Positive for color change. Negative for rash and wound.  Allergic/Immunologic: Negative for immunocompromised state.  Neurological: Negative for dizziness, weakness, light-headedness and numbness.  Hematological:  Does not bruise/bleed easily.   Physical Exam Updated Vital Signs BP (!) 126/97 (BP Location: Left Arm)   Pulse 83   Temp 98.3 F (36.8 C) (Oral)   Resp 19   SpO2 98%   Physical Exam Vitals signs and nursing note reviewed.  Constitutional:      General: He is not in acute distress.    Appearance: He is well-developed. He is not diaphoretic.  HENT:     Head: Normocephalic and atraumatic.  Neck:     Musculoskeletal: Normal range of motion.  Cardiovascular:     Rate and Rhythm: Normal rate  and regular rhythm.     Heart sounds: Normal heart sounds. No murmur. No friction rub. No gallop.   Pulmonary:     Effort: Pulmonary effort is normal. No respiratory distress.     Breath sounds: Normal breath sounds. No wheezing or rales.  Chest:     Chest wall: No tenderness.  Abdominal:     Palpations: Abdomen is soft.     Tenderness: There is no abdominal tenderness.  Musculoskeletal: Normal range of motion.     Right shoulder: Normal. He exhibits normal range of motion, no tenderness, no bony tenderness, no swelling, no deformity and no laceration.     Left shoulder: Normal. He exhibits normal range of motion, no tenderness and no bony tenderness.     Right elbow: Normal.He exhibits normal range of motion, no swelling, no effusion and no deformity.     Left elbow: Normal. He exhibits normal range of motion, no swelling and no effusion.     Right wrist: Normal. He exhibits normal range of motion, no tenderness and no bony tenderness.     Left wrist: Normal. He exhibits normal range of motion, no tenderness and no bony tenderness.     Right upper arm: He exhibits tenderness, swelling and edema. He exhibits no bony tenderness, no deformity and no laceration.     Left upper arm: Normal. He exhibits no tenderness, no bony tenderness and no swelling.     Right forearm: Normal. He exhibits no tenderness, no bony tenderness, no swelling and no edema.     Left forearm: Normal. He exhibits no tenderness, no bony tenderness and no swelling.     Comments: Bruising and edema noted over right upper arm. Tenderness to palpation. Full ROM of shoulder and elbows bilaterally. Sensation intact. 5/5 strength in upper extremities. 2+ radial pulses bilaterally.   Skin:    General: Skin is warm.     Findings: No erythema or rash.  Neurological:     Mental Status: He is alert.      ED Treatments / Results  Labs (all labs ordered are listed, but only abnormal results are displayed) Labs Reviewed - No  data to display  EKG None  Radiology Dg Humerus Right  Result Date: 01/30/2019 CLINICAL DATA:  53 year old male status post blunt trauma with pain swelling and bruising. EXAM: RIGHT HUMERUS - 2+ VIEW COMPARISON:  None. FINDINGS: Bone mineralization is within normal limits. There is no evidence of fracture or other focal bone lesions. Alignment appears preserved at the right shoulder and elbow. Negative visible right chest. IMPRESSION: Negative. Electronically Signed   By: Odessa FlemingH  Hall M.D.   On: 01/30/2019 17:17    Procedures Procedures (including critical care time)  Medications Ordered in ED Medications  acetaminophen (TYLENOL) tablet 650 mg (650 mg Oral Given 01/30/19 1748)     Initial Impression / Assessment and Plan / ED  Course  I have reviewed the triage vital signs and the nursing notes.  Pertinent labs & imaging results that were available during my care of the patient were reviewed by me and considered in my medical decision making (see chart for details).  Clinical Course as of Jan 30 1840  Thu Jan 30, 2019  1816 Negative humerus x ray.  DG Humerus Right [AH]    Clinical Course User Index [AH] Arville Lime, PA-C      Patient presents with right upper arm pain after injury. Patient X-Ray negative for obvious fracture or dislocation. Pain managed in ED. Pt advised to follow up with orthopedics if symptoms persist for possibility of missed fracture diagnosis. Patient given sling for comfort, tylenol, and ice while in ED, conservative therapy recommended and discussed. Patient will be dc home & is agreeable with above plan.  Final Clinical Impressions(s) / ED Diagnoses   Final diagnoses:  Arm injury, right, initial encounter    ED Discharge Orders    None       Arville Lime, [place] 01/30/19 1842    Milton Ferguson, MD 02/02/19 [phone]

## 2019-01-30 NOTE — ED Triage Notes (Signed)
Pt reports that early this morning (thinks around 530am) dog was barking and when went to go see what is was caught right arm on half wall near his stairs. Reports as day has gone on, right upper arm pains gotten worse with selling.

## 2020-05-22 IMAGING — CR RIGHT HUMERUS - 2+ VIEW
2 series · 2 of 2 positions shown · non-contrast
Comparison: None.

CLINICAL DATA: 53-year-old male status post blunt trauma with pain
swelling and bruising.

EXAM:
RIGHT HUMERUS - 2+ VIEW

[w humerus ap right]
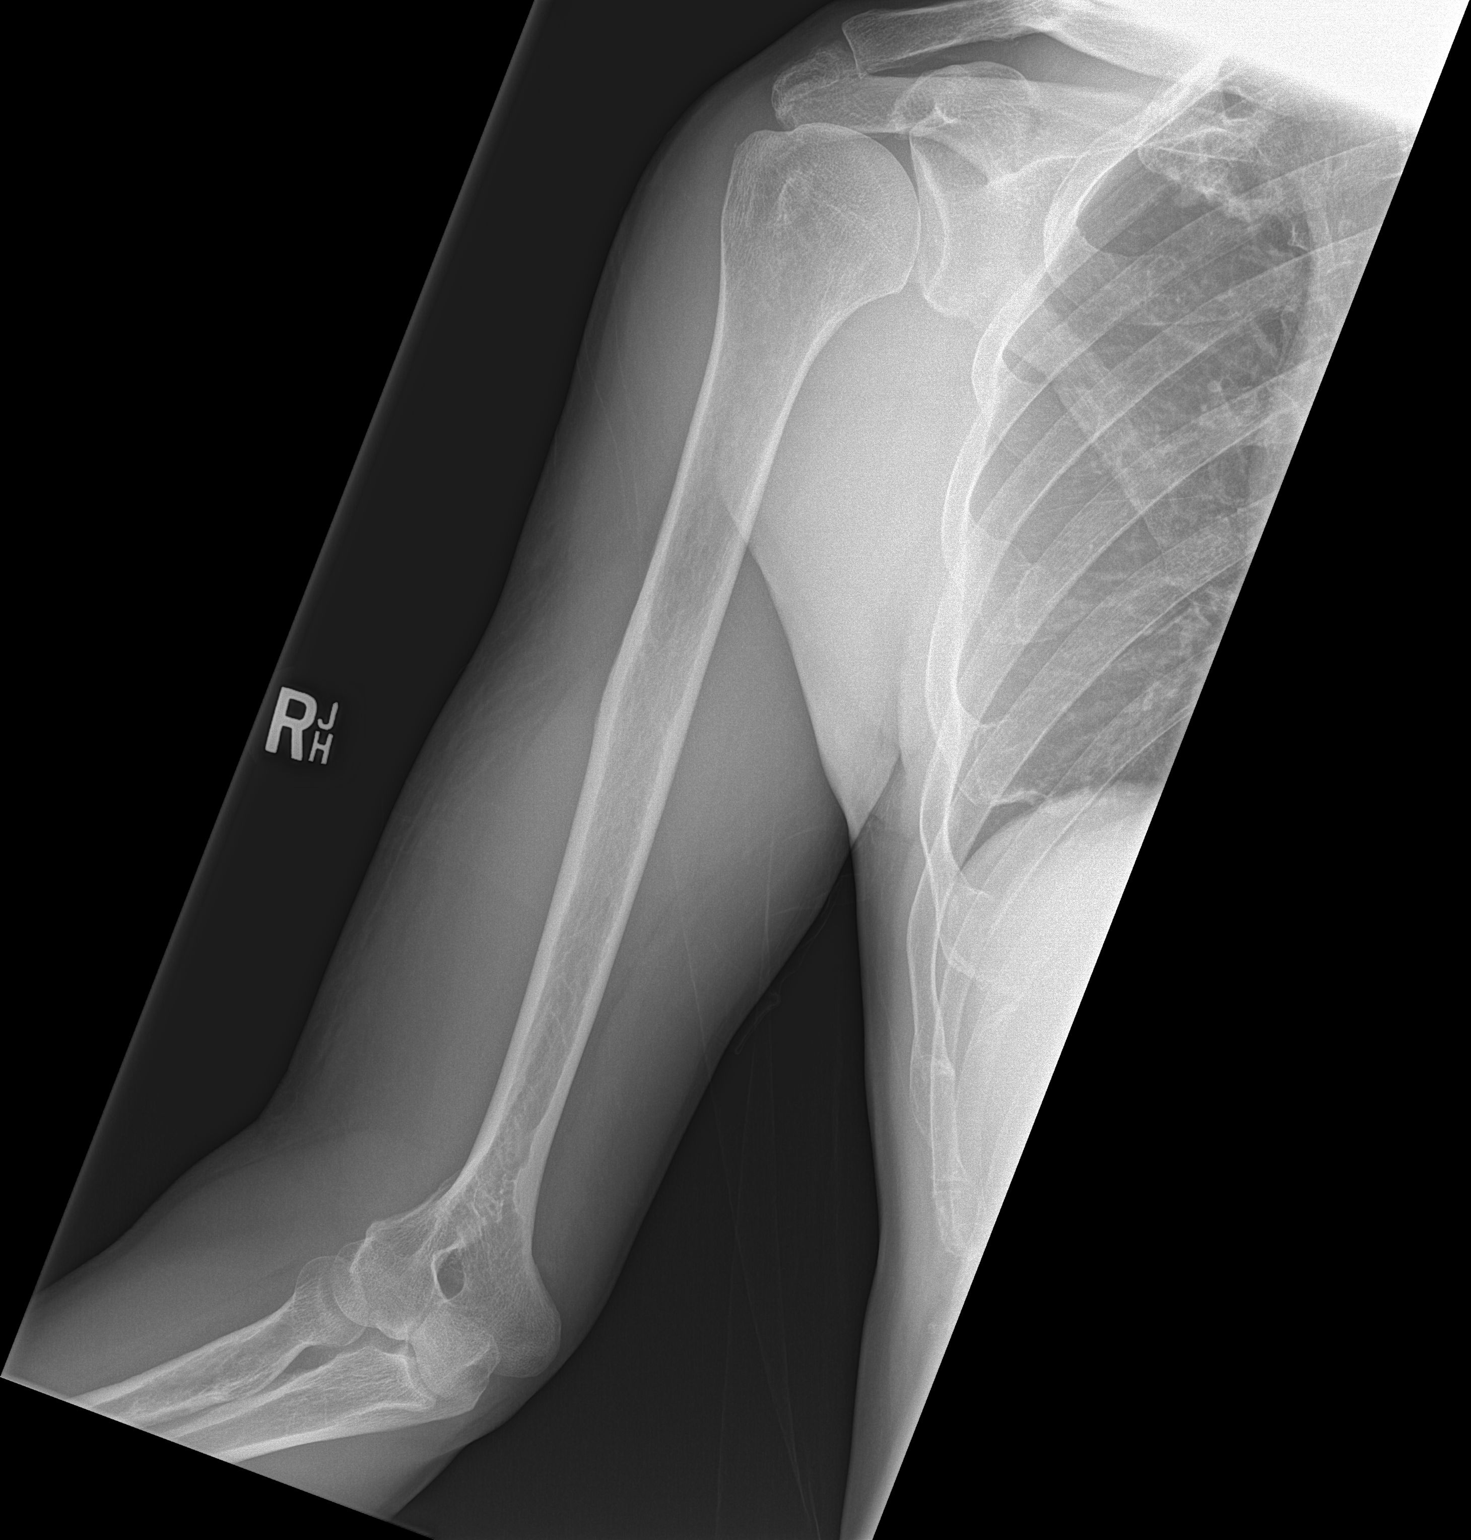

[w humerus lat right]
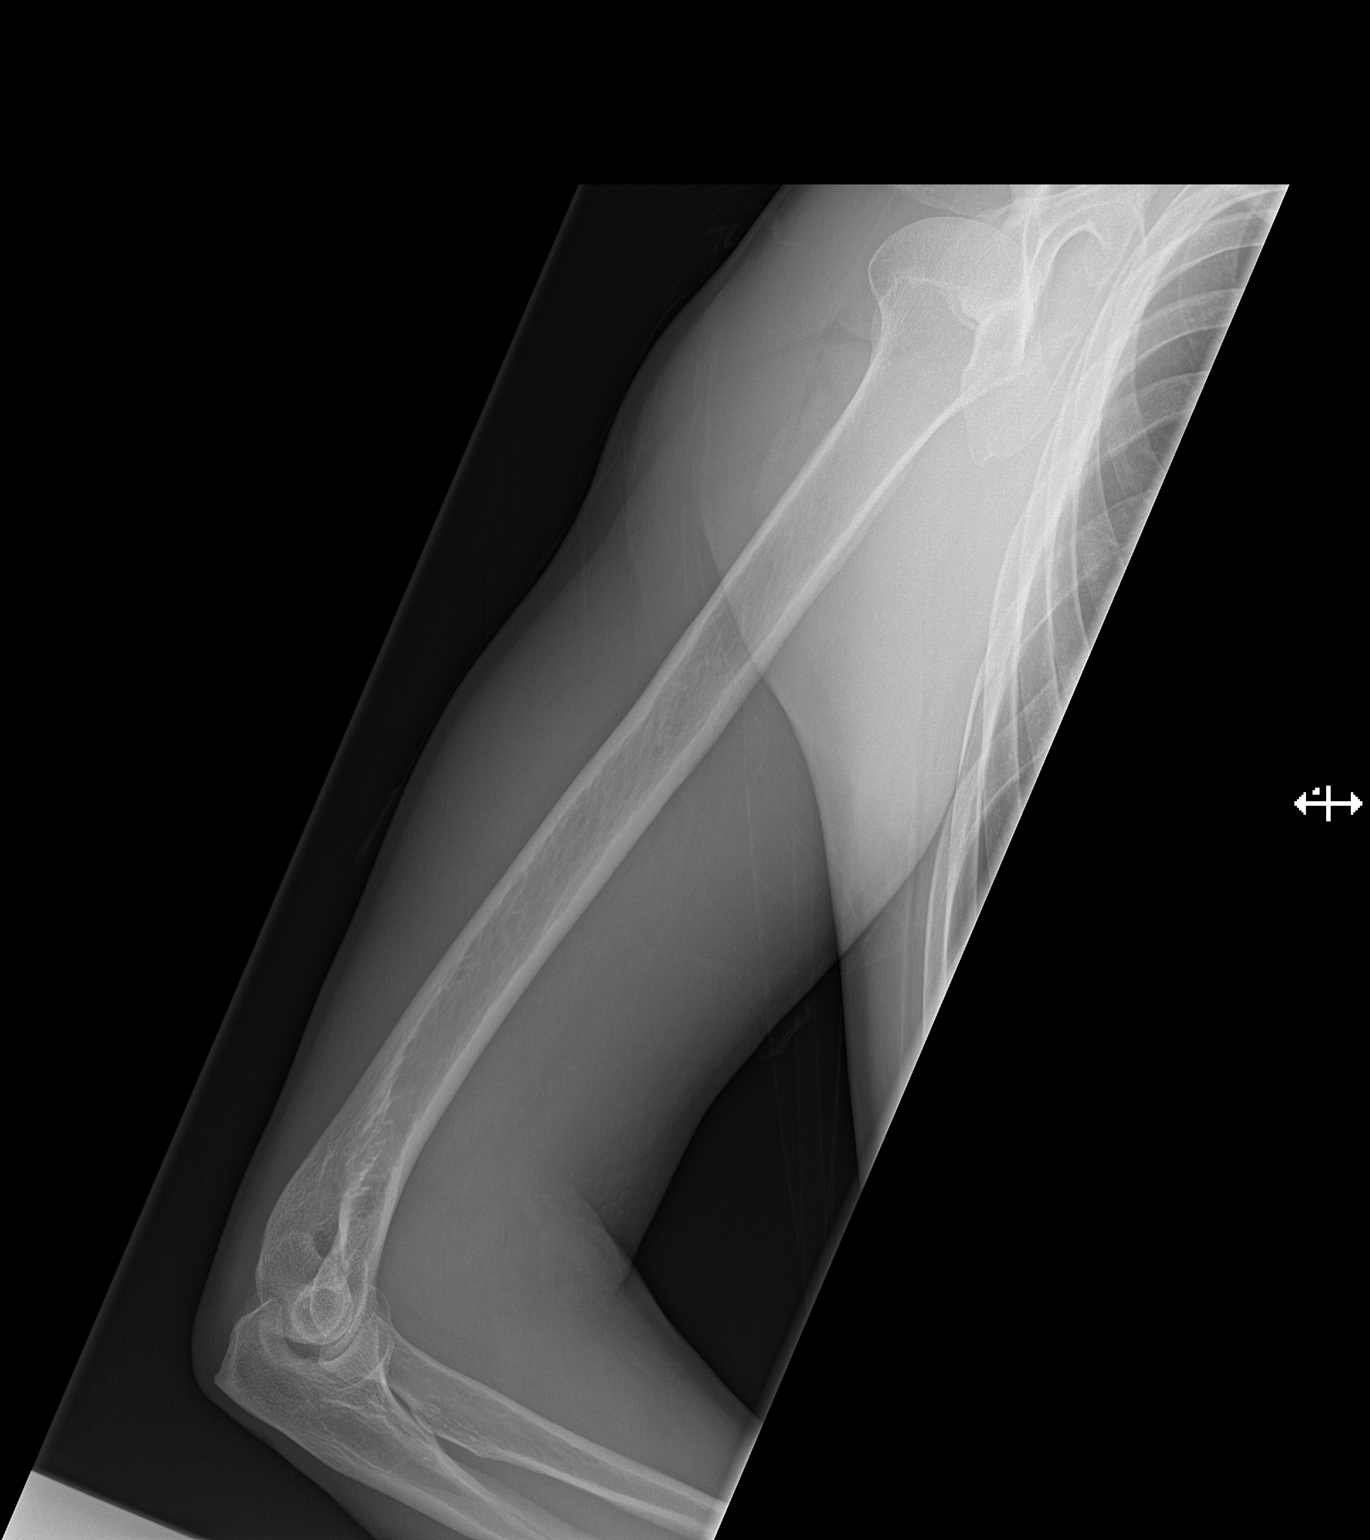

[2 of 2 positions shown; findings below may reference images not displayed]

FINDINGS: Bone mineralization is within normal limits. There is no evidence of
fracture or other focal bone lesions. Alignment appears preserved at
the right shoulder and elbow. Negative visible right chest.
IMPRESSION: Negative.

## 2022-02-27 ENCOUNTER — Encounter (HOSPITAL_COMMUNITY): Payer: Self-pay

## 2022-02-27 ENCOUNTER — Other Ambulatory Visit: Payer: Self-pay

## 2022-02-27 ENCOUNTER — Emergency Department (HOSPITAL_COMMUNITY)
Admission: EM | Admit: 2022-02-27 | Discharge: 2022-02-27 | Disposition: A | Discharge status: Home / Self Care | Payer: BC Managed Care – PPO | Attending: Emergency Medicine | Admitting: Emergency Medicine

## 2022-02-27 DIAGNOSIS — S8992XA Unspecified injury of left lower leg, initial encounter: Secondary | ICD-10-CM | POA: Diagnosis present

## 2022-02-27 DIAGNOSIS — W268XXA Contact with other sharp object(s), not elsewhere classified, initial encounter: Secondary | ICD-10-CM | POA: Insufficient documentation

## 2022-02-27 DIAGNOSIS — S81812A Laceration without foreign body, left lower leg, initial encounter: Secondary | ICD-10-CM | POA: Insufficient documentation

## 2022-02-27 MED ORDER — ACETAMINOPHEN 325 MG PO TABS
ORAL_TABLET | ORAL | Status: AC
  Filled 2022-02-27: qty 1

## 2022-02-27 MED ORDER — IBUPROFEN 200 MG PO TABS
600.0000 mg | ORAL_TABLET | Freq: Once | ORAL | Status: AC
  Administered 2022-02-27: 600 mg via ORAL
  Filled 2022-02-27: qty 3

## 2022-02-27 MED ORDER — CEPHALEXIN 500 MG PO CAPS: 500.0000 mg | ORAL_CAPSULE | Freq: Four times a day (QID) | ORAL | 0 refills | Status: AC

## 2022-02-27 MED ORDER — BACITRACIN ZINC 500 UNIT/GM EX OINT
TOPICAL_OINTMENT | Freq: Once | CUTANEOUS | Status: AC
  Administered 2022-02-27: 1 via TOPICAL
  Filled 2022-02-27: qty 0.9

## 2022-02-27 MED ORDER — LIDOCAINE HCL (PF) 1 % IJ SOLN
5.0000 mL | Freq: Once | INTRAMUSCULAR | Status: AC
  Administered 2022-02-27: 5 mL
  Filled 2022-02-27: qty 30

## 2022-02-27 MED ORDER — ACETAMINOPHEN 325 MG PO TABS
650.0000 mg | ORAL_TABLET | Freq: Once | ORAL | Status: AC
  Administered 2022-02-27: 650 mg via ORAL
  Filled 2022-02-27: qty 2

## 2022-02-27 NOTE — ED Notes (Signed)
Had to re pull one tylenol due to dropping it on the floor

## 2022-02-27 NOTE — Discharge Instructions (Addendum)
You were seen in the emergency department for your laceration.  Because your laceration was over a day old and already healing, this is not stitched as this will increase your risk of infection and poor healing.  You should continue to wash your wound daily and dress it with antibiotic ointment (Bacitracin, neosporin).  We gave you a prescription for antibiotic pills to prevent infection. You should follow-up in the primary care clinic to have your wound rechecked in the next 2 to 3 days to ensure proper healing.  You can take Tylenol and Motrin as needed for pain.  You should return to the emergency department if you get significantly worsening pain, you have numbness or weakness in your leg, you have spreading redness or warmth in the wound, pus draining from the wound, fevers or any other new or concerning symptoms.

## 2022-02-27 NOTE — ED Triage Notes (Signed)
Patient reports that he tripped on a doggie gate yesterday morning and when he tripped there was metal on the top which cut his left lateral calf. Patient states the laceration was deep. Patient also put super Glue on part of the wound.

## 2022-02-27 NOTE — ED Provider Notes (Signed)
[facility]-EMERGENCY DEPT Provider Note   CSN: [id number] Arrival date & time: 02/27/22  6948     History  Chief Complaint  Patient presents with   Extremity Laceration    Kenneth Good is a 56 y.o. male.  Patient is a 56 year old male with no significant past medical history presenting to the emergency department with a laceration to his left lower leg.  Patient states that early Sunday morning he was coming down the stairs and accidentally cut his leg on a metal piece of a baby gate on the stairs.  He denies anything breaking off the gait and into the wound.  He states that he has been washing the wound 3 times per day but still noticed some oozing this morning and had pain and decided to come to the emergency department.  Denies any numbness or weakness of his leg.  His tetanus was last updated in 2017.  He denies any surrounding erythema, warmth or drainage but states that it is tender to touch.  The history is provided by the patient.       Home Medications Prior to Admission medications   Medication Sig Start Date End Date Taking? Authorizing Provider  cephALEXin (KEFLEX) 500 MG capsule Take 1 capsule (500 mg total) by mouth 4 (four) times daily for 5 days. 02/27/22 03/04/22 Yes Cristela Stalder, Cecile Sheerer, DO      Allergies    Patient has no known allergies.    Review of Systems   Review of Systems  Physical Exam Updated Vital Signs Pulse 99   Temp 98 F (36.7 C) (Oral)   Resp 18   Ht 5\' 10"  (1.778 m)   Wt 72.6 kg   SpO2 99%   BMI 22.96 kg/m  Physical Exam Constitutional:      Appearance: Normal appearance.  HENT:     Head: Normocephalic and atraumatic.     Mouth/Throat:     Mouth: Mucous membranes are moist.  Eyes:     Extraocular Movements: Extraocular movements intact.  Pulmonary:     Effort: Pulmonary effort is normal.  Abdominal:     General: Abdomen is flat.  Musculoskeletal:        General: Normal range of motion.     Cervical back:  Normal range of motion and neck supple.  Skin:    General: Skin is warm and dry.     Comments: ~6 cm gaping laceration to L lateral shin with healing granulation tissue. No active bleeding, no surrounding erythema, warmth or drainage. Tenderness to palpation of laceration, but compartments soft. No visible foreign body  Neurological:     General: No focal deficit present.     Mental Status: He is alert and oriented to person, place, and time.  Psychiatric:        Mood and Affect: Mood normal.        Behavior: Behavior normal.     ED Results / Procedures / Treatments   Labs (all labs ordered are listed, but only abnormal results are displayed) Labs Reviewed - No data to display  EKG None  Radiology No results found.  Procedures Procedures    Medications Ordered in ED Medications  ibuprofen (ADVIL) tablet 600 mg (600 mg Oral Given 02/27/22 1039)  acetaminophen (TYLENOL) tablet 650 mg (650 mg Oral Given 02/27/22 1039)  lidocaine (PF) (XYLOCAINE) 1 % injection 5 mL (5 mLs Infiltration Given 02/27/22 1039)  bacitracin ointment (1 Application Topical Given 02/27/22 1042)    ED Course/  Medical Decision Making/ A&P                           Medical Decision Making Patient is a 56 year old male presenting to the emergency department with a laceration to his left lower leg.  The wound is greater than 24 hours old and with granulation tissue so laceration repair will not be performed.  The patient will be given Tylenol and Motrin for pain and will have wound irrigation performed and wound will be inspected for any foreign bodies.  He will be dressed with bacitracin antibiotic ointment.  Risk OTC drugs. Prescription drug management.   Patient's wound was irrigated and no foreign bodies were visualized.  Patient's wound was dressed with bacitracin.  He was given Keflex for antibiotic prophylaxis.  He states that he has primary care follow-up tomorrow for wound recheck.  Patient stable  for discharge and questions were answered.        Final Clinical Impression(s) / ED Diagnoses Final diagnoses:  Laceration of left lower extremity, initial encounter    Rx / DC Orders ED Discharge Orders          Ordered    cephALEXin (KEFLEX) 500 MG capsule  4 times daily        02/27/22 1100              [place]any [place], [place] K, DO 02/27/22 1101

## 2023-10-23 ENCOUNTER — Emergency Department (HOSPITAL_COMMUNITY)
Admission: EM | Admit: 2023-10-23 | Discharge: 2023-10-23 | Disposition: A | Discharge status: Home / Self Care | Attending: Emergency Medicine | Admitting: Emergency Medicine

## 2023-10-23 ENCOUNTER — Encounter (HOSPITAL_COMMUNITY): Payer: Self-pay

## 2023-10-23 ENCOUNTER — Emergency Department (HOSPITAL_COMMUNITY)

## 2023-10-23 ENCOUNTER — Other Ambulatory Visit: Payer: Self-pay

## 2023-10-23 DIAGNOSIS — S0083XA Contusion of other part of head, initial encounter: Secondary | ICD-10-CM

## 2023-10-23 DIAGNOSIS — S0003XA Contusion of scalp, initial encounter: Secondary | ICD-10-CM | POA: Insufficient documentation

## 2023-10-23 DIAGNOSIS — H1131 Conjunctival hemorrhage, right eye: Secondary | ICD-10-CM | POA: Insufficient documentation

## 2023-10-23 DIAGNOSIS — S0990XA Unspecified injury of head, initial encounter: Secondary | ICD-10-CM | POA: Diagnosis present

## 2023-10-23 DIAGNOSIS — W01198A Fall on same level from slipping, tripping and stumbling with subsequent striking against other object, initial encounter: Secondary | ICD-10-CM | POA: Insufficient documentation

## 2023-10-23 DIAGNOSIS — H538 Other visual disturbances: Secondary | ICD-10-CM

## 2023-10-23 MED ORDER — MECLIZINE HCL 25 MG PO TABS: 25.0000 mg | ORAL_TABLET | Freq: Three times a day (TID) | ORAL | 0 refills | Status: DC | PRN

## 2023-10-23 MED ORDER — NAPROXEN 375 MG PO TABS: 375.0000 mg | ORAL_TABLET | Freq: Two times a day (BID) | ORAL | 0 refills | Status: AC

## 2023-10-23 NOTE — ED Triage Notes (Signed)
 Patient is here for evaluation after having a fall on Saturday. Pt reports he tripped and fell over his dog and hit his head on the ceramic tile. Pt reports LOC. Pt reports that vision in his right eye is blurry and has become more blurry over the past few days. Patient also reports dizziness and a headache. States he is having trouble remembering things. Pt reports right rib pain as well, stating he fell and landed on the right side. Pt denies any nausea or vomiting.

## 2023-10-23 NOTE — Discharge Instructions (Addendum)
 1) You need to be seen by an eye doctor in the next day or so. Please call your eye doctor tomorrow to be seen for your blurry vision. If you cannot get in to see your doctor. Please call doctor SUN.  2) your scans are all negative. You have not broken anything; You likely have a concussion. The bump on your head is called a hematoma and is where you broke a blood vessel and have a collection of blood under the skin.  Your skull is not broken.  Continue to apply ice to your forehead over a towel.  Discharging you with anti-inflammatory medications and something for dizziness as you likely have a concussion.

## 2023-10-23 NOTE — ED Provider Triage Note (Signed)
 Emergency Medicine Provider Triage Evaluation Note  Kenneth Good , a 58 y.o. male  was evaluated in triage.  Pt complains of a headache, facial bruising, right rib pain after a fall. He was descending the stairs when he tripped over his dog, falling 4 steps and landing on ceramic floor. Reported loss of consciousness.   Review of Systems  Positive: Headache, facial trauma, loss of consciousness, dizziness Negative: Abdominal pain, extremity pain, shortness of breath, nausea, vomiting  Physical Exam  BP (!) 135/91   Pulse 90   Temp 98 F (36.7 C) (Oral)   Resp 18   Ht 5\' 10"  (1.778 m)   Wt 72.6 kg   SpO2 99%   BMI 22.97 kg/m  Gen:   Awake, no distress   Resp:  Normal effort. Right sided rib pain  MSK:   Moves extremities without difficulty  Other:  Scalp hematoma, orbital ecchymosis. Horizontal nystagmus.  Medical Decision Making  Medically screening exam initiated at 3:39 PM.  Appropriate orders placed.  Kenneth Good was informed that the remainder of the evaluation will be completed by another provider, this initial triage assessment does not replace that evaluation, and the importance of remaining in the ED until their evaluation is complete.     Felicie Morn, NP 10/23/23 [phone]

## 2023-10-23 NOTE — ED Provider Notes (Signed)
 [place] EMERGENCY DEPARTMENT AT [facility] Provider Note   CSN: [id number] Arrival date & time: 10/23/23  1516     History  Chief Complaint  Patient presents with   [name]    Kenneth Good is a 58 y.o. male who presents emergency department after mechanical fall this past Saturday.  He reports tripping over his dog falling and hitting the left side of his face onto ceramic tile.  Since that time he has had some headaches, blurry vision in his right eye which he states seems to be worsening some mild dizziness.  He was concerned he could have a skull fracture because of the swelling on his right forehead.  He also has a documented history of alcohol dependence.   Fall       Home Medications Prior to Admission medications   Not on File      Allergies    Patient has no known allergies.    Review of Systems   Review of Systems  Physical Exam Updated Vital Signs BP (!) 135/91   Pulse 90   Temp 98 F (36.7 C) (Oral)   Resp 18   Ht 5\' 10"  (1.778 m)   Wt 72.6 kg   SpO2 99%   BMI 22.97 kg/m  Physical Exam Vitals and nursing note reviewed.  Constitutional:      General: He is not in acute distress.    Appearance: He is well-developed. He is not diaphoretic.  HENT:     Head: Normocephalic.     Comments: Hematoma of the right scalp, large orbital hematoma and bruising along the right zygoma.  No signs of entrapment Eyes:     General: Vision grossly intact. Gaze aligned appropriately. No scleral icterus.    Conjunctiva/sclera: Conjunctivae normal.     Funduscopic exam:    Right eye: Hemorrhage present. Red reflex present.        Left eye: No hemorrhage. Red reflex present.    Comments: Patient appears to have some hemorrhage on the right funduscopic exam, red reflex present throughout   Cardiovascular:     Rate and Rhythm: Normal rate and regular rhythm.     Heart sounds: Normal heart sounds.  Pulmonary:     Effort: Pulmonary effort is normal. No  respiratory distress.     Breath sounds: Normal breath sounds.  Abdominal:     Palpations: Abdomen is soft.     Tenderness: There is no abdominal tenderness.  Musculoskeletal:     Cervical back: Normal range of motion and neck supple.  Skin:    General: Skin is warm and dry.  Neurological:     Mental Status: He is alert.     Comments: Speech is clear and goal oriented, follows commands Major Cranial nerves without deficit, no facial droop Normal strength in upper and lower extremities bilaterally including dorsiflexion and plantar flexion, strong and equal grip strength Sensation normal to light and sharp touch Moves extremities without ataxia, coordination intact Normal finger to nose and rapid alternating movements Neg romberg, no pronator drift Normal gait Normal heel-shin and balance'  Psychiatric:        Behavior: Behavior normal.     ED Results / Procedures / Treatments   Labs (all labs ordered are listed, but only abnormal results are displayed) Labs Reviewed - No data to display  EKG None  Radiology DG Ribs Unilateral W/Chest Right Result Date: 10/23/2023 CLINICAL DATA:  Fall, right rib pain EXAM: RIGHT RIBS AND CHEST - 3+  VIEW COMPARISON:  02/08/2014 FINDINGS: No fracture or other bone lesions are seen involving the right ribs. There is no evidence of pneumothorax or pleural effusion. Both lungs are clear. Heart size and mediastinal contours are within normal limits. IMPRESSION: Negative. Electronically Signed   By: Duanne Guess D.O.   On: 10/23/2023 17:20   CT Head Wo Contrast Result Date: 10/23/2023 CLINICAL DATA:  Head trauma, moderate-severe; Polytrauma, blunt; Facial trauma, blunt. Recent fall with loss of consciousness. Worsening right eye blurry vision. Dizziness and headache. EXAM: CT HEAD WITHOUT CONTRAST CT MAXILLOFACIAL WITHOUT CONTRAST CT CERVICAL SPINE WITHOUT CONTRAST TECHNIQUE: Multidetector CT imaging of the head, cervical spine, and maxillofacial  structures were performed using the standard protocol without intravenous contrast. Multiplanar CT image reconstructions of the cervical spine and maxillofacial structures were also generated. RADIATION DOSE REDUCTION: This exam was performed according to the departmental dose-optimization program which includes automated exposure control, adjustment of the mA and/or kV according to patient size and/or use of iterative reconstruction technique. COMPARISON:  CT maxillofacial 02/03/2013.  CT head 05/25/2011. FINDINGS: CT HEAD FINDINGS Brain: There is no evidence of an acute infarct, intracranial hemorrhage, mass, midline shift, or extra-axial fluid collection. Cerebral volume is within normal limits for age. The ventricles are normal in size. Vascular: No hyperdense vessel. Skull: No acute fracture or suspicious lesion. Other: Small right frontal scalp hematoma/swelling. CT MAXILLOFACIAL FINDINGS Osseous: No acute fracture or mandibular dislocation. Remote, healed fractures of the mandibular condyles bilaterally. Orbits: Unremarkable. Sinuses: Minimal mucosal thickening inferiorly in the frontal sinuses. No sinus fluid level. Clear mastoid air cells and middle ear cavities. Soft tissues: Right periorbital soft tissue swelling. CT CERVICAL SPINE FINDINGS Alignment: Mild cervical spine straightening. No significant listhesis. Skull base and vertebrae: No acute fracture or suspicious lesion. Soft tissues and spinal canal: No prevertebral fluid or swelling. No visible canal hematoma. Disc levels: Multilevel disc degeneration, most advanced from C4-5 through C6-7 where there is likely moderate spinal stenosis and moderate to severe neural foraminal stenosis. Upper chest: Clear lung apices. Other: None. IMPRESSION: 1. No evidence of acute intracranial abnormality. 2. No acute maxillofacial or cervical spine fracture. Electronically Signed   By: Sebastian Ache M.D.   On: 10/23/2023 16:38   CT Maxillofacial WO CM Result  Date: 10/23/2023 CLINICAL DATA:  Head trauma, moderate-severe; Polytrauma, blunt; Facial trauma, blunt. Recent fall with loss of consciousness. Worsening right eye blurry vision. Dizziness and headache. EXAM: CT HEAD WITHOUT CONTRAST CT MAXILLOFACIAL WITHOUT CONTRAST CT CERVICAL SPINE WITHOUT CONTRAST TECHNIQUE: Multidetector CT imaging of the head, cervical spine, and maxillofacial structures were performed using the standard protocol without intravenous contrast. Multiplanar CT image reconstructions of the cervical spine and maxillofacial structures were also generated. RADIATION DOSE REDUCTION: This exam was performed according to the departmental dose-optimization program which includes automated exposure control, adjustment of the mA and/or kV according to patient size and/or use of iterative reconstruction technique. COMPARISON:  CT maxillofacial 02/03/2013.  CT head 05/25/2011. FINDINGS: CT HEAD FINDINGS Brain: There is no evidence of an acute infarct, intracranial hemorrhage, mass, midline shift, or extra-axial fluid collection. Cerebral volume is within normal limits for age. The ventricles are normal in size. Vascular: No hyperdense vessel. Skull: No acute fracture or suspicious lesion. Other: Small right frontal scalp hematoma/swelling. CT MAXILLOFACIAL FINDINGS Osseous: No acute fracture or mandibular dislocation. Remote, healed fractures of the mandibular condyles bilaterally. Orbits: Unremarkable. Sinuses: Minimal mucosal thickening inferiorly in the frontal sinuses. No sinus fluid level. Clear mastoid air cells and middle  ear cavities. Soft tissues: Right periorbital soft tissue swelling. CT CERVICAL SPINE FINDINGS Alignment: Mild cervical spine straightening. No significant listhesis. Skull base and vertebrae: No acute fracture or suspicious lesion. Soft tissues and spinal canal: No prevertebral fluid or swelling. No visible canal hematoma. Disc levels: Multilevel disc degeneration, most advanced  from C4-5 through C6-7 where there is likely moderate spinal stenosis and moderate to severe neural foraminal stenosis. Upper chest: Clear lung apices. Other: None. IMPRESSION: 1. No evidence of acute intracranial abnormality. 2. No acute maxillofacial or cervical spine fracture. Electronically Signed   By: Sebastian Ache M.D.   On: 10/23/2023 16:38   CT Cervical Spine Wo Contrast Result Date: 10/23/2023 CLINICAL DATA:  Head trauma, moderate-severe; Polytrauma, blunt; Facial trauma, blunt. Recent fall with loss of consciousness. Worsening right eye blurry vision. Dizziness and headache. EXAM: CT HEAD WITHOUT CONTRAST CT MAXILLOFACIAL WITHOUT CONTRAST CT CERVICAL SPINE WITHOUT CONTRAST TECHNIQUE: Multidetector CT imaging of the head, cervical spine, and maxillofacial structures were performed using the standard protocol without intravenous contrast. Multiplanar CT image reconstructions of the cervical spine and maxillofacial structures were also generated. RADIATION DOSE REDUCTION: This exam was performed according to the departmental dose-optimization program which includes automated exposure control, adjustment of the mA and/or kV according to patient size and/or use of iterative reconstruction technique. COMPARISON:  CT maxillofacial 02/03/2013.  CT head 05/25/2011. FINDINGS: CT HEAD FINDINGS Brain: There is no evidence of an acute infarct, intracranial hemorrhage, mass, midline shift, or extra-axial fluid collection. Cerebral volume is within normal limits for age. The ventricles are normal in size. Vascular: No hyperdense vessel. Skull: No acute fracture or suspicious lesion. Other: Small right frontal scalp hematoma/swelling. CT MAXILLOFACIAL FINDINGS Osseous: No acute fracture or mandibular dislocation. Remote, healed fractures of the mandibular condyles bilaterally. Orbits: Unremarkable. Sinuses: Minimal mucosal thickening inferiorly in the frontal sinuses. No sinus fluid level. Clear mastoid air cells and  middle ear cavities. Soft tissues: Right periorbital soft tissue swelling. CT CERVICAL SPINE FINDINGS Alignment: Mild cervical spine straightening. No significant listhesis. Skull base and vertebrae: No acute fracture or suspicious lesion. Soft tissues and spinal canal: No prevertebral fluid or swelling. No visible canal hematoma. Disc levels: Multilevel disc degeneration, most advanced from C4-5 through C6-7 where there is likely moderate spinal stenosis and moderate to severe neural foraminal stenosis. Upper chest: Clear lung apices. Other: None. IMPRESSION: 1. No evidence of acute intracranial abnormality. 2. No acute maxillofacial or cervical spine fracture. Electronically Signed   By: Sebastian Ache M.D.   On: 10/23/2023 16:38    Procedures Procedures    Medications Ordered in ED Medications - No data to display  ED Course/ Medical Decision Making/ A&P                                 Medical Decision Making Risk Prescription drug management.   Patient here with fall, head and eye contusion on the right side and blurry vision.  Visualized and interpreted triage ordered imaging including CT head, CT maxillofacial and cervical spine images as well as a right rib view chest x-ray. There are no acute findings on any of the images.  Patient's vision is blurry.  On exam it appears he may have some hemorrhage on the retina but no evidence of detachment.  No evidence of entrapment or other severe eye injury.  I discussed the case with Dr. Wynelle Link who states it is okayed for him to  follow-up in clinic.  Patient be discharged with naproxen and meclizine for minor head injury and dizziness.  He appears otherwise appropriate for discharge at this time.        Final Clinical Impression(s) / ED Diagnoses Final diagnoses:  None    Rx / DC Orders ED Discharge Orders     None         Arthor Captain, PA-C 10/23/23 1921    Vanetta Mulders, MD 10/24/23 2310

## 2023-10-29 ENCOUNTER — Other Ambulatory Visit: Payer: Self-pay

## 2023-10-29 ENCOUNTER — Telehealth (HOSPITAL_COMMUNITY): Payer: Self-pay | Admitting: Emergency Medicine

## 2023-10-29 ENCOUNTER — Encounter (HOSPITAL_COMMUNITY): Payer: Self-pay

## 2023-10-29 ENCOUNTER — Emergency Department (HOSPITAL_COMMUNITY)
Admission: EM | Admit: 2023-10-29 | Discharge: 2023-10-29 | Disposition: A | Discharge status: Home / Self Care | Attending: Emergency Medicine | Admitting: Emergency Medicine

## 2023-10-29 DIAGNOSIS — X58XXXA Exposure to other specified factors, initial encounter: Secondary | ICD-10-CM | POA: Diagnosis not present

## 2023-10-29 DIAGNOSIS — S0990XA Unspecified injury of head, initial encounter: Secondary | ICD-10-CM | POA: Diagnosis present

## 2023-10-29 DIAGNOSIS — S060X1A Concussion with loss of consciousness of 30 minutes or less, initial encounter: Secondary | ICD-10-CM | POA: Insufficient documentation

## 2023-10-29 MED ORDER — MECLIZINE HCL 25 MG PO TABS: 25.0000 mg | ORAL_TABLET | Freq: Three times a day (TID) | ORAL | 0 refills | Status: DC | PRN

## 2023-10-29 MED ORDER — MECLIZINE HCL 25 MG PO TABS: 25.0000 mg | ORAL_TABLET | Freq: Three times a day (TID) | ORAL | 0 refills | Status: AC | PRN

## 2023-10-29 NOTE — ED Provider Notes (Signed)
 [place] EMERGENCY DEPARTMENT AT [facility] Provider Note  CSN: [id number] Arrival date & time: 10/29/23 1556  Chief Complaint(s) Head Injury  HPI Kenneth Good is a 58 y.o. male who is here today after a fall on 325.  Patient was seen, the imaging of the head, neck and face that were negative.  He states that since then he has been having intermittent episodes of dizziness, that resolved.  He is requesting a work note, and a neurology referral.   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 Authorizing Provider  meclizine (ANTIVERT) 25 MG tablet Take 1 tablet (25 mg total) by mouth 3 (three) times daily as needed for dizziness. 10/29/23  Yes Anders Simmonds T, DO  naproxen (NAPROSYN) 375 MG tablet Take 1 tablet (375 mg total) by mouth 2 (two) times daily with a meal. 10/23/23   Arthor Captain, PA-C                                                                                                                                    Past Surgical History Past Surgical History:  Procedure Laterality Date   CLOSED REDUCTION MANDIBLE N/A 02/06/2013   Procedure: CLOSED REDUCTION MANDIBULAR FRACTURE;  Surgeon: Suzanna Obey, MD;  Location: [facility] OR;  Service: ENT;  Laterality: N/A;   LACERATION REPAIR     arm   MANDIBULAR HARDWARE REMOVAL N/A 03/27/2013   Procedure: MANDIBULAR HARDWARE REMOVAL AND ARCH BAR REMOVAL ;  Surgeon: Suzanna Obey, MD;  Location: [facility];  Service: ENT;  Laterality: N/A;   ORIF MANDIBULAR FRACTURE N/A 02/06/2013   Procedure: MAXILLARY MANDIBULAR FIXATION;  Surgeon: Suzanna Obey, MD;   Location: [facility];  Service: ENT;  Laterality: N/A;   WISDOM TOOTH EXTRACTION     Family History Family History  Problem Relation Age of Onset   Alcohol abuse Father    Alcohol abuse Brother    Alcohol abuse Paternal Grandfather     Social History Social History   Tobacco Use   Smoking status: Never  Vaping Use   Vaping status: Never Used  Substance Use Topics   Alcohol use: Yes    Alcohol/week: 70.0 standard drinks of alcohol    Types: 70 Cans of beer per week   Drug use: No   Allergies Patient has no known allergies.  Review of Systems Review of Systems  Physical Exam Vital Signs  I have reviewed the triage vital signs BP (!) 149/97   Pulse 99   Temp 98.4 F (36.9 C) (  Oral)   Resp 18   Ht 5\' 10"  (1.778 m)   Wt 73.5 kg   SpO2 96%   BMI 23.24 kg/m   Physical Exam Vitals reviewed.  Eyes:     Pupils: Pupils are equal, round, and reactive to light.  Neurological:     General: No focal deficit present.     Mental Status: He is alert.     Cranial Nerves: No cranial nerve deficit.     Motor: No weakness.     Gait: Gait normal.     ED Results and Treatments Labs (all labs ordered are listed, but only abnormal results are displayed) Labs Reviewed - No data to display                                                                                                                        Radiology No results found.  Pertinent labs & imaging results that were available during my care of the patient were reviewed by me and considered in my medical decision making (see MDM for details).  Medications Ordered in ED Medications - No data to display                                                                                                                                   Procedures Procedures  (including critical care time)  Medical Decision Making / ED Course   This patient presents to the ED for concern of intermittent dizziness, this involves an  extensive number of treatment options, and is a complaint that carries with it a high risk of complications and morbidity.  The differential diagnosis includes concussion, vertigo, less likely posterior CVA.  MDM: Patient with no neurological deficits.  He has been having intermittent headaches and dizziness, likely related to his fall, probable concussion.  No indication for repeated imaging today based on reassuring exam.  Will provide patient with neurology follow-up.  He was instructed to again follow-up with ophthalmology if he had repeated episodes of blurry vision in his right eye which are not occurring today.   Additional history obtained: -Additional history obtained from  -External records from outside source obtained and reviewed including: Chart review including previous notes, labs, imaging, consultation notes   Lab Tests: -I ordered, reviewed, and interpreted labs.   The pertinent results include:   Labs Reviewed - No data to display  Imaging Studies ordered: I ordered imaging studies including  I independently visualized and interpreted imaging. I agree with the radiologist interpretation   Medicines ordered and prescription drug management: Meds ordered this encounter  Medications   meclizine (ANTIVERT) 25 MG tablet    Sig: Take 1 tablet (25 mg total) by mouth 3 (three) times daily as needed for dizziness.    Dispense:  30 tablet    Refill:  0    -I have reviewed the patients home medicines and have made adjustments as needed   Reevaluation: After the interventions noted above, I reevaluated the patient and found that they have :improved  Co morbidities that complicate the patient evaluation  Past Medical History:  Diagnosis Date   Alcohol abuse    Anxiety       Dispostion: Discharge     Final Clinical Impression(s) / ED Diagnoses Final diagnoses:  Concussion with loss of consciousness of 30 minutes or less, initial encounter      @PCDICTATION @    Anders Simmonds T, DO 10/29/23 1713

## 2023-10-29 NOTE — ED Triage Notes (Signed)
 Pt diagnosed with concussion on 3/25.States he is having dizziness in the morning that lasts a few hours and then subsides. 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 Requesting a work note.

## 2023-10-29 NOTE — Discharge Instructions (Addendum)
 In your discharge paperwork is a telephone number for a neurologist.  They should give you a call this week to set a follow-up appointment.  If you are feeling dizzy, you can take meclizine.  Please follow-up with your PCP.
